# Patient Record
Sex: Female | Born: 1940 | Race: Black or African American | Hispanic: No | Marital: Single | Smoking: Never smoker
Health system: Southern US, Community
[De-identification: ages and names within clinical notes are randomized; demographics above are authoritative.]

## PROBLEM LIST (undated history)

## (undated) DIAGNOSIS — I10 Essential (primary) hypertension: Secondary | ICD-10-CM

## (undated) HISTORY — PX: CHOLECYSTECTOMY: SHX55

## (undated) HISTORY — PX: TOE SURGERY: SHX1073

## (undated) HISTORY — PX: KNEE ARTHROSCOPY: SUR90

## (undated) HISTORY — PX: APPENDECTOMY: SHX54

---

## 1998-01-28 ENCOUNTER — Emergency Department (HOSPITAL_COMMUNITY): Admission: EM | Admit: 1998-01-28 | Discharge: 1998-01-28 | Payer: Self-pay | Admitting: Emergency Medicine

## 1998-10-11 ENCOUNTER — Ambulatory Visit (HOSPITAL_COMMUNITY): Admission: RE | Admit: 1998-10-11 | Discharge: 1998-10-11 | Payer: Self-pay | Admitting: Cardiology

## 1998-10-11 ENCOUNTER — Encounter: Payer: Self-pay | Admitting: Cardiology

## 1998-12-07 ENCOUNTER — Encounter: Payer: Self-pay | Admitting: Gastroenterology

## 1998-12-07 ENCOUNTER — Ambulatory Visit (HOSPITAL_COMMUNITY): Admission: RE | Admit: 1998-12-07 | Discharge: 1998-12-07 | Payer: Self-pay | Admitting: Gastroenterology

## 1998-12-07 ENCOUNTER — Encounter: Payer: Self-pay | Admitting: General Surgery

## 1998-12-07 ENCOUNTER — Inpatient Hospital Stay (HOSPITAL_COMMUNITY): Admission: AD | Admit: 1998-12-07 | Discharge: 1998-12-10 | Payer: Self-pay | Admitting: Psychology

## 1999-02-27 ENCOUNTER — Ambulatory Visit (HOSPITAL_COMMUNITY): Admission: RE | Admit: 1999-02-27 | Discharge: 1999-02-27 | Payer: Self-pay | Admitting: Cardiology

## 1999-10-13 ENCOUNTER — Encounter: Payer: Self-pay | Admitting: Cardiology

## 1999-10-13 ENCOUNTER — Ambulatory Visit (HOSPITAL_COMMUNITY): Admission: RE | Admit: 1999-10-13 | Discharge: 1999-10-13 | Payer: Self-pay | Admitting: Cardiology

## 2001-01-07 ENCOUNTER — Ambulatory Visit (HOSPITAL_COMMUNITY): Admission: RE | Admit: 2001-01-07 | Discharge: 2001-01-07 | Payer: Self-pay | Admitting: Cardiology

## 2001-01-07 ENCOUNTER — Encounter: Payer: Self-pay | Admitting: Cardiology

## 2001-03-05 ENCOUNTER — Emergency Department (HOSPITAL_COMMUNITY): Admission: EM | Admit: 2001-03-05 | Discharge: 2001-03-05 | Payer: Self-pay

## 2001-05-15 ENCOUNTER — Ambulatory Visit (HOSPITAL_COMMUNITY): Admission: RE | Admit: 2001-05-15 | Discharge: 2001-05-15 | Payer: Self-pay | Admitting: Gastroenterology

## 2002-01-12 ENCOUNTER — Encounter: Payer: Self-pay | Admitting: Cardiology

## 2002-01-12 ENCOUNTER — Ambulatory Visit (HOSPITAL_COMMUNITY): Admission: RE | Admit: 2002-01-12 | Discharge: 2002-01-12 | Payer: Self-pay | Admitting: Cardiology

## 2003-01-14 ENCOUNTER — Ambulatory Visit (HOSPITAL_COMMUNITY): Admission: RE | Admit: 2003-01-14 | Discharge: 2003-01-14 | Payer: Self-pay | Admitting: Cardiology

## 2003-01-14 ENCOUNTER — Encounter: Payer: Self-pay | Admitting: Cardiology

## 2003-10-25 ENCOUNTER — Ambulatory Visit (HOSPITAL_COMMUNITY): Admission: RE | Admit: 2003-10-25 | Discharge: 2003-10-25 | Payer: Self-pay | Admitting: Cardiology

## 2004-01-17 ENCOUNTER — Ambulatory Visit (HOSPITAL_COMMUNITY): Admission: RE | Admit: 2004-01-17 | Discharge: 2004-01-17 | Payer: Self-pay | Admitting: Cardiology

## 2004-10-03 ENCOUNTER — Ambulatory Visit (HOSPITAL_COMMUNITY): Admission: RE | Admit: 2004-10-03 | Discharge: 2004-10-03 | Payer: Self-pay | Admitting: Cardiology

## 2005-01-17 ENCOUNTER — Ambulatory Visit (HOSPITAL_COMMUNITY): Admission: RE | Admit: 2005-01-17 | Discharge: 2005-01-17 | Payer: Self-pay | Admitting: Cardiology

## 2005-03-22 ENCOUNTER — Emergency Department (HOSPITAL_COMMUNITY): Admission: EM | Admit: 2005-03-22 | Discharge: 2005-03-22 | Payer: Self-pay | Admitting: Emergency Medicine

## 2005-04-02 ENCOUNTER — Ambulatory Visit (HOSPITAL_COMMUNITY): Admission: RE | Admit: 2005-04-02 | Discharge: 2005-04-02 | Payer: Self-pay | Admitting: Cardiology

## 2006-01-21 ENCOUNTER — Emergency Department (HOSPITAL_COMMUNITY): Admission: EM | Admit: 2006-01-21 | Discharge: 2006-01-21 | Payer: Self-pay | Admitting: Emergency Medicine

## 2006-02-28 ENCOUNTER — Ambulatory Visit (HOSPITAL_COMMUNITY): Admission: RE | Admit: 2006-02-28 | Discharge: 2006-02-28 | Payer: Self-pay | Admitting: Cardiology

## 2006-03-08 ENCOUNTER — Encounter: Admission: RE | Admit: 2006-03-08 | Discharge: 2006-03-08 | Payer: Self-pay | Admitting: Cardiology

## 2006-03-12 ENCOUNTER — Ambulatory Visit (HOSPITAL_COMMUNITY): Admission: RE | Admit: 2006-03-12 | Discharge: 2006-03-13 | Payer: Self-pay | Admitting: General Surgery

## 2006-03-12 ENCOUNTER — Encounter (INDEPENDENT_AMBULATORY_CARE_PROVIDER_SITE_OTHER): Payer: Self-pay | Admitting: *Deleted

## 2007-03-03 ENCOUNTER — Encounter: Admission: RE | Admit: 2007-03-03 | Discharge: 2007-03-03 | Payer: Self-pay | Admitting: Cardiology

## 2007-12-22 ENCOUNTER — Emergency Department (HOSPITAL_BASED_OUTPATIENT_CLINIC_OR_DEPARTMENT_OTHER): Admission: EM | Admit: 2007-12-22 | Discharge: 2007-12-22 | Payer: Self-pay | Admitting: Emergency Medicine

## 2007-12-29 ENCOUNTER — Emergency Department (HOSPITAL_BASED_OUTPATIENT_CLINIC_OR_DEPARTMENT_OTHER): Admission: EM | Admit: 2007-12-29 | Discharge: 2007-12-29 | Payer: Self-pay | Admitting: Emergency Medicine

## 2008-03-04 ENCOUNTER — Encounter: Admission: RE | Admit: 2008-03-04 | Discharge: 2008-03-04 | Payer: Self-pay | Admitting: Cardiology

## 2008-08-28 ENCOUNTER — Emergency Department (HOSPITAL_BASED_OUTPATIENT_CLINIC_OR_DEPARTMENT_OTHER): Admission: EM | Admit: 2008-08-28 | Discharge: 2008-08-28 | Payer: Self-pay | Admitting: Emergency Medicine

## 2008-12-04 ENCOUNTER — Emergency Department (HOSPITAL_BASED_OUTPATIENT_CLINIC_OR_DEPARTMENT_OTHER): Admission: EM | Admit: 2008-12-04 | Discharge: 2008-12-04 | Payer: Self-pay | Admitting: Emergency Medicine

## 2009-03-07 ENCOUNTER — Encounter: Admission: RE | Admit: 2009-03-07 | Discharge: 2009-03-07 | Payer: Self-pay | Admitting: Cardiology

## 2010-03-08 ENCOUNTER — Encounter: Admission: RE | Admit: 2010-03-08 | Discharge: 2010-03-08 | Payer: Self-pay | Admitting: Cardiology

## 2010-08-13 ENCOUNTER — Encounter: Payer: Self-pay | Admitting: Cardiology

## 2010-10-31 LAB — RAPID STREP SCREEN (MED CTR MEBANE ONLY): Streptococcus, Group A Screen (Direct): NEGATIVE

## 2010-12-08 NOTE — Op Note (Signed)
Theresa Moon, Theresa Moon            ACCOUNT NO.:  [id number]   MEDICAL RECORD NO.:  [id number]          PATIENT TYPE:  OIB   LOCATION:  1413                         FACILITY:  [facility]   PHYSICIAN:  Adolph Pollack, M.D.DATE OF BIRTH:  [birth year]   DATE OF PROCEDURE:  03/12/2006  DATE OF DISCHARGE:                                 OPERATIVE REPORT   PREOPERATIVE DIAGNOSIS:  Symptomatic cholelithiasis.   POSTOPERATIVE DIAGNOSIS:  Cholelithiasis with chronic cholecystitis.   PROCEDURE:  Laparoscopic cholecystectomy with intraoperative cholangiogram.   SURGEON:  Dr. Abbey Chatters   ASSISTANT:  Baruch Merl, MD   ANESTHESIA:  General.   INDICATIONS:  This is a 70 year old female who had a case of biliary colic  and found to have gallstones.  I saw her in the office and we talked about  elective cholecystectomy.  She was not sure whether she wanted to proceed at  that time but called back shortly thereafter and decided she wanted to  schedule elective surgery and now presents for that.   TECHNIQUE:  She is seen in the holding area then brought to the operating  room, placed supine on the operating table, and a general anesthetic was  administered.  Her abdominal wall was sterilely prepped and draped.  Then  0.25% plain Marcaine solution was infiltrated in the subumbilical region and  a subumbilical incision made through the skin, subcutaneous tissue, fascia,  and peritoneum entering the peritoneal cavity.  Pursestring suture of 0  Vicryl was placed around the fascial edges.  A Hassan trocar was introduced  to the peritoneal cavity and pneumoperitoneum was created by insufflation of  CO2 gas.   She was placed in reverse Trendelenburg position, and an 11 mm trocar was  placed in the epigastrium and two 5 mm trocars placed in the right mid  lateral abdomen.  She was then rotated to the left.  The fundus of the  gallbladder was grasped.  The gallbladder was somewhat pale in color  consistent with chronic inflammatory changes.  The fundus was retracted  toward the right shoulder.  The infundibulum was grasped using careful blunt  dissection.  Electrocautery was mobilized.  I noted a posterior branch of  the cystic artery which is clipped and divided.  I then isolated the cystic  duct and cystic artery and created windows around them.  I clipped the  cystic artery proximally and distally and divided it.  I then placed a clip  at the cystic duct gallbladder junction and made a small incision in the  cystic duct.  Bile was milked back, but no stones were milked back.  A  cholangiocatheter was passed through the anterior abdominal wall, placed  into the cystic duct, and a cholangiogram was performed.   Under real time fluoroscopy, dilute contrast material was injected into the  cystic duct which was of moderate to long length.  The common hepatic, right  and left hepatic, and common bile ducts all were visualized, and the  contrast spilled into the duodenum promptly without obvious evidence of  obstruction.  Final reports pending the radiologist's interpretation.   The cholangiocatheter  was removed.  The cystic duct was clipped 3 times  proximally and divided.  The gallbladder was dissected free from the liver  bed intact with electrocautery and placed in an Endopouch bag.  The  gallbladder fossa was irrigated.  Hemostasis was adequate, and no bile leak  was noted.  The perihepatic area was irrigated and fluid evacuated by way of  suction.   The gallbladder was removed the subumbilical incision, and then the  subumbilical fascial defect was closed under laparoscopic vision by  tightening up and tying down the pursestring suture.  The remaining trocars  were removed, and pneumoperitoneum was released.  The skin incisions were  closed with 4-0 Monocryl subcuticular stitches followed by Steri-Strips and  sterile dressings.   She tolerated the procedure well without  apparent complications and was  taken to the recovery room in satisfactory condition.      Adolph Pollack, M.D.  Electronically Signed     TJR/MEDQ  D:  03/12/2006  T:  03/12/2006  Job:  [id number]   cc:   Eduardo Osier. Sharyn Lull, M.D.  Fax: [phone]

## 2010-12-08 NOTE — Op Note (Signed)
[place]. [facility]  Patient:    Theresa Moon, Theresa Moon Visit Number: [id number] MRN: [id number]          Service Type: END Location: ENDO Attending Physician:  Charna Elizabeth Dictated by:   Anselmo Rod, M.D. Proc. Date: 05/15/01 Admit Date:  05/15/2001 Discharge Date: 05/15/2001   CC:         Eduardo Osier. Sharyn Lull, M.D.   Operative Report  DATE OF BIRTH:  01/28/1941.  PROCEDURE PERFORMED:  Esophagogastroduodenoscopy.  ENDOSCOPIST:  Anselmo Rod, M.D.  INSTRUMENT USED:  Olympus video pan endoscope.  INDICATION FOR PROCEDURE:  A 70 year old African-American female with epigastric pain.  Rule out peptic ulcer disease, esophagitis, gastritis, etc.  PREPROCEDURE PREPARATION:  Informed consent was procured from the patient. The patient was fasted for eight hours prior to procedure.  PREPROCEDURE PHYSICAL:  VITAL SIGNS:  The patient has stable vital signs.  NECK:  Supple.  CHEST:  Clear to auscultation.  S1, S2 regular.  ABDOMEN:  Soft with normal bowel sounds.  DESCRIPTION OF PROCEDURE:  The patient was placed in the left lateral decubitus position and sedated with 55 mg of Demerol and 5 mg of Versed intravenously.  Once the patient was adequately sedate and maintained on low-flow oxygen and continuous cardiac monitoring, the Olympus video pan endoscope was advanced through a mouthpiece over the tongue into the esophagus under direct vision.  The entire esophagus appeared normal without evidence of ring stricture, masses, lesions, esophagitis, or Barretts mucosa.  The scope was then advanced into the stomach.  Except for a small hiatal hernia, no other abnormalities were seen.  The entire gastric mucosa and ______ small bowel appeared normal.  IMPRESSION:  Normal esophagogastroduodenoscopy except for small hiatal hernia.  RECOMMENDATIONS:  Proceed with colonoscopy at this time. Dictated by:   Anselmo Rod, M.D. Attending Physician:   Charna Elizabeth DD:  05/15/01 TD:  05/18/01 Job: [id number]

## 2010-12-08 NOTE — Procedures (Signed)
[place]. [facility]  Patient:    Theresa Moon, Theresa Moon Visit Number: [id number] MRN: [id number]          Service Type: END Location: ENDO Attending Physician:  Charna Elizabeth Dictated by:   Anselmo Rod, M.D. Proc. Date: 05/15/01 Admit Date:  05/15/2001 Discharge Date: 05/15/2001   CC:         Eduardo Osier. Sharyn Lull, M.D.   Procedure Report  DATE OF BIRTH:  12-30-1940.  PROCEDURE:  Colonoscopy.  ENDOSCOPIST:  Anselmo Rod, M.D.  INSTRUMENT USED:  Olympus video colonoscope.  INDICATION FOR PROCEDURE:  Guaiac-positive stools in a 70 year old African-American female.  Rule out colonic polyps, masses, hemorrhoids, etc.  PREPROCEDURE PREPARATION:  Informed consent was procured from the patient. The patient was fasted for eight hours prior to the procedure.  PREPROCEDURE PHYSICAL:  VITAL SIGNS:  The patient had stable vital signs.  NECK:  Supple.  CHEST:  Clear to auscultation.  S1, S2 regular.  ABDOMEN:  Soft with normal bowel sounds.  DESCRIPTION OF PROCEDURE:  The patient was placed in the left lateral decubitus position and sedated with 40 mg of Demerol and 4 mg of Versed intravenously.  Once the patient was adequately sedate and maintained on low-flow oxygen and continuous cardiac monitoring, the Olympus video colonoscope was advanced from the rectum to the cecum without difficulty. Except for moderate-sized internal hemorrhoids, no other abnormalities were noted.  The patient tolerated the procedure well without complication.  IMPRESSION:  A healthy-appearing colon except for moderate-sized internal hemorrhoids.  RECOMMENDATIONS: 1. Repeat guaiac testing will be done on an outpatient basis and further    recommendations will be made as needed. 2. A high-fiber diet has been suggested for now. 3. Outpatient follow-up has been advised within the next four weeks. Dictated by:   Anselmo Rod, M.D. Attending Physician:  Charna Elizabeth DD:  05/15/01 TD:  05/18/01 Job: [id number]

## 2011-02-06 ENCOUNTER — Other Ambulatory Visit: Payer: Self-pay | Admitting: Cardiology

## 2011-02-06 ENCOUNTER — Other Ambulatory Visit (HOSPITAL_COMMUNITY): Payer: Self-pay | Admitting: Cardiology

## 2011-02-06 DIAGNOSIS — Z1231 Encounter for screening mammogram for malignant neoplasm of breast: Secondary | ICD-10-CM

## 2011-02-15 ENCOUNTER — Ambulatory Visit
Admission: RE | Admit: 2011-02-15 | Discharge: 2011-02-15 | Disposition: A | Discharge status: Home / Self Care | Payer: Medicare Other | Source: Ambulatory Visit | Attending: Cardiology | Admitting: Cardiology

## 2011-02-15 DIAGNOSIS — Z1231 Encounter for screening mammogram for malignant neoplasm of breast: Secondary | ICD-10-CM

## 2011-04-19 LAB — BASIC METABOLIC PANEL
GFR calc non Af Amer: >60
Glucose, Bld: 93
Potassium: 4.4

## 2011-04-19 LAB — DIFFERENTIAL
Basophils Absolute: 0
Eosinophils Absolute: 0.1
Eosinophils Relative: 3
Lymphocytes Relative: 40
Lymphs Abs: 1.9
Monocytes Absolute: 0.5

## 2011-04-19 LAB — CBC
MCHC: 34.3
MCV: 86.8
Platelets: 306
RBC: 4.62

## 2012-02-20 ENCOUNTER — Other Ambulatory Visit: Payer: Self-pay | Admitting: Cardiology

## 2012-02-20 DIAGNOSIS — Z1231 Encounter for screening mammogram for malignant neoplasm of breast: Secondary | ICD-10-CM

## 2012-03-04 ENCOUNTER — Ambulatory Visit: Payer: Medicare Other

## 2012-03-05 ENCOUNTER — Ambulatory Visit
Admission: RE | Admit: 2012-03-05 | Discharge: 2012-03-05 | Disposition: A | Discharge status: Home / Self Care | Payer: Medicare Other | Source: Ambulatory Visit | Attending: Cardiology | Admitting: Cardiology

## 2012-03-05 DIAGNOSIS — Z1231 Encounter for screening mammogram for malignant neoplasm of breast: Secondary | ICD-10-CM

## 2012-05-07 ENCOUNTER — Emergency Department (HOSPITAL_BASED_OUTPATIENT_CLINIC_OR_DEPARTMENT_OTHER): Payer: Medicare Other

## 2012-05-07 ENCOUNTER — Encounter (HOSPITAL_BASED_OUTPATIENT_CLINIC_OR_DEPARTMENT_OTHER): Payer: Self-pay | Admitting: Family Medicine

## 2012-05-07 ENCOUNTER — Emergency Department (HOSPITAL_BASED_OUTPATIENT_CLINIC_OR_DEPARTMENT_OTHER)
Admission: EM | Admit: 2012-05-07 | Discharge: 2012-05-07 | Disposition: A | Discharge status: Home / Self Care | Payer: Medicare Other | Attending: Emergency Medicine | Admitting: Emergency Medicine

## 2012-05-07 DIAGNOSIS — J40 Bronchitis, not specified as acute or chronic: Secondary | ICD-10-CM | POA: Insufficient documentation

## 2012-05-07 DIAGNOSIS — J069 Acute upper respiratory infection, unspecified: Secondary | ICD-10-CM

## 2012-05-07 DIAGNOSIS — I10 Essential (primary) hypertension: Secondary | ICD-10-CM | POA: Insufficient documentation

## 2012-05-07 HISTORY — DX: Essential (primary) hypertension: I10

## 2012-05-07 MED ORDER — IPRATROPIUM BROMIDE 0.02 % IN SOLN
0.5000 mg | Freq: Once | RESPIRATORY_TRACT | Status: AC
  Administered 2012-05-07: 0.5 mg via RESPIRATORY_TRACT
  Filled 2012-05-07: qty 2.5

## 2012-05-07 MED ORDER — ALBUTEROL SULFATE HFA 108 (90 BASE) MCG/ACT IN AERS: 2.0000 | INHALATION_SPRAY | RESPIRATORY_TRACT | Status: DC | PRN

## 2012-05-07 MED ORDER — PREDNISONE 50 MG PO TABS: 50.0000 mg | ORAL_TABLET | Freq: Every day | ORAL | Status: DC

## 2012-05-07 MED ORDER — ALBUTEROL SULFATE (5 MG/ML) 0.5% IN NEBU
2.5000 mg | INHALATION_SOLUTION | RESPIRATORY_TRACT | Status: AC
  Administered 2012-05-07: 2.5 mg via RESPIRATORY_TRACT
  Filled 2012-05-07: qty 0.5

## 2012-05-07 MED ORDER — PREDNISONE 50 MG PO TABS
60.0000 mg | ORAL_TABLET | Freq: Once | ORAL | Status: AC
  Administered 2012-05-07: 60 mg via ORAL
  Filled 2012-05-07: qty 1

## 2012-05-07 NOTE — ED Notes (Signed)
Pt c/o throat feeling sore, pt sts "I'm afraid I am getting bronchitis". Pt c/o mild cough with clear phlegm and dull headache x 2 days. Pt denies n/v/d.

## 2012-05-07 NOTE — ED Notes (Signed)
Patient transported to X-ray 

## 2012-05-07 NOTE — ED Provider Notes (Signed)
History     CSN: [id number]  Arrival date & time 05/07/12  1104   First MD Initiated Contact with Patient 05/07/12 1253      Chief Complaint  Patient presents with  . Sore Throat    (Consider location/radiation/quality/duration/timing/severity/associated sxs/prior treatment) Patient is a 71 y.o. female presenting with pharyngitis. The history is provided by the patient.  Sore Throat  She has had a respiratory infection for the last 5 days. She has had some rhinorrhea and postnasal drainage, sore throat, ringing in her ears, and a cough which is productive of a small amount of clear to slightly yellow sputum. She has had subjective fever as well as chills and sweats. She denies dyspnea, nausea, vomiting. She denies arthralgias and myalgias. She has not taken any medication. Cough is worse at night, nothing else seems to affect her symptoms.  Past Medical History  Diagnosis Date  . Hypertension     Past Surgical History  Procedure Date  . Toe surgery   . Appendectomy   . Cholecystectomy   . Knee arthroscopy     No family history on file.  History  Substance Use Topics  . Smoking status: Never Smoker   . Smokeless tobacco: Not on file  . Alcohol Use: No    OB History    Grav Para Term Preterm Abortions TAB SAB Ect Mult Living                  Review of Systems  All other systems reviewed and are negative.    Allergies  Motrin  Home Medications   Current Outpatient Rx  Name Route Sig Dispense Refill  . ASPIRIN 81 MG PO TABS Oral Take 81 mg by mouth daily.    Marland Kitchen TIAZAC PO Oral Take by mouth.    . RABEPRAZOLE SODIUM 20 MG PO TBEC Oral Take 20 mg by mouth daily.    Marland Kitchen RAMIPRIL PO Oral Take by mouth.    . ROSUVASTATIN CALCIUM 10 MG PO TABS Oral Take 10 mg by mouth daily.      BP 176/80  Pulse 61  Temp 97.5 F (36.4 C) (Oral)  Resp 16  Ht 5\' 3"  (1.6 m)  Wt 150 lb (68.04 kg)  BMI 26.57 kg/m2  SpO2 99%  Physical Exam  Nursing note and vitals  reviewed.  71 year old female, resting comfortably and in no acute distress. Vital signs are significant for hypertension with blood pressure 176/80. Oxygen saturation is 99%, which is normal. Head is normocephalic and atraumatic. PERRLA, EOMI. Pharynx is mildly to moderately erythematous without exudate. Neck is nontender and supple without adenopathy or JVD. Back is nontender and there is no CVA tenderness. Lungs are clear without rales, wheezes, or rhonchi. Chest is nontender. Heart has regular rate and rhythm without murmur. Abdomen is soft, flat, nontender without masses or hepatosplenomegaly and peristalsis is normoactive. Extremities have no cyanosis or edema, full range of motion is present. Skin is warm and dry without rash. Neurologic: Mental status is normal, cranial nerves are intact, there are no motor or sensory deficits.  ED Course  Procedures (including critical care time)  Results for orders placed during the hospital encounter of 05/07/12  RAPID STREP SCREEN      Component Value Range   Streptococcus, Group A Screen (Direct) NEGATIVE  NEGATIVE   Dg Chest 2 View  05/07/2012  *RADIOLOGY REPORT*  Clinical Data: Cough  CHEST - 2 VIEW  Comparison: 01/21/2006  Findings: Normal heart size  and normal pulmonary vascularity. Negative for pneumonia or effusion.  Lungs are clear.  IMPRESSION: Negative   Original Report Authenticated By: Camelia Phenes, M.D.    Images viewed by me.   1. Upper respiratory infection   2. Bronchitis       MDM  Respiratory tract infection. Strep screen will be obtained to rule out streptococcal infection, chest x-ray will be obtained to rule out pneumonia, and she'll be given a therapeutic trial of albuterol with Atrovent as if it helps her cough.  She does significant subjective relief from albuterol with Atrovent. Chest x-ray and strep screens are negative. She will be sent home with prescriptions for prednisone and albuterol inhaler. I do  not see any indication for antibiotic use at this point. She's to followup with her PCP if not improving     Dione Booze, MD 05/07/12 [phone]

## 2013-05-12 ENCOUNTER — Other Ambulatory Visit: Payer: Self-pay

## 2013-05-12 DIAGNOSIS — Z1231 Encounter for screening mammogram for malignant neoplasm of breast: Secondary | ICD-10-CM

## 2013-06-14 ENCOUNTER — Emergency Department (HOSPITAL_BASED_OUTPATIENT_CLINIC_OR_DEPARTMENT_OTHER)
Admission: EM | Admit: 2013-06-14 | Discharge: 2013-06-14 | Disposition: A | Discharge status: Home / Self Care | Payer: Medicare Other | Attending: Emergency Medicine | Admitting: Emergency Medicine

## 2013-06-14 ENCOUNTER — Emergency Department (HOSPITAL_BASED_OUTPATIENT_CLINIC_OR_DEPARTMENT_OTHER): Payer: Medicare Other

## 2013-06-14 ENCOUNTER — Encounter (HOSPITAL_BASED_OUTPATIENT_CLINIC_OR_DEPARTMENT_OTHER): Payer: Self-pay | Admitting: Emergency Medicine

## 2013-06-14 DIAGNOSIS — I10 Essential (primary) hypertension: Secondary | ICD-10-CM | POA: Insufficient documentation

## 2013-06-14 DIAGNOSIS — J40 Bronchitis, not specified as acute or chronic: Secondary | ICD-10-CM

## 2013-06-14 DIAGNOSIS — J029 Acute pharyngitis, unspecified: Secondary | ICD-10-CM | POA: Insufficient documentation

## 2013-06-14 DIAGNOSIS — Z79899 Other long term (current) drug therapy: Secondary | ICD-10-CM | POA: Insufficient documentation

## 2013-06-14 DIAGNOSIS — Z7982 Long term (current) use of aspirin: Secondary | ICD-10-CM | POA: Insufficient documentation

## 2013-06-14 DIAGNOSIS — J209 Acute bronchitis, unspecified: Secondary | ICD-10-CM | POA: Insufficient documentation

## 2013-06-14 MED ORDER — DOXYCYCLINE HYCLATE 100 MG PO CAPS: 100.0000 mg | ORAL_CAPSULE | Freq: Two times a day (BID) | ORAL | Status: DC

## 2013-06-14 MED ORDER — HYDROCOD POLST-CHLORPHEN POLST 10-8 MG/5ML PO LQCR: 5.0000 mL | Freq: Every evening | ORAL | Status: DC | PRN

## 2013-06-14 NOTE — ED Notes (Signed)
Patient transported to X-ray 

## 2013-06-14 NOTE — ED Provider Notes (Signed)
CSN: [id number]     Arrival date & time 06/14/13  1010 History   First MD Initiated Contact with Patient 06/14/13 1039     Chief Complaint  Patient presents with  . Nasal Congestion   (Consider location/radiation/quality/duration/timing/severity/associated sxs/prior Treatment) HPI Comments: Patient presents with nasal congestion. She states 4 days ago she started having some nasal congestion that seems to be worsening and she's had a little bit of blood in her sputum now. She does have a cough that seems to be worsening over last few days. She denies any chest pain or shortness of breath. She denies any fevers or chills. She had a little bit of bleeding from her nose as well. She denies any leg swelling. She denies any nausea vomiting or diarrhea. She's been taking over-the-counter medicines with some improvement of symptoms.   Past Medical History  Diagnosis Date  . Hypertension    Past Surgical History  Procedure Laterality Date  . Toe surgery    . Appendectomy    . Cholecystectomy    . Knee arthroscopy     No family history on file. History  Substance Use Topics  . Smoking status: Never Smoker   . Smokeless tobacco: Not on file  . Alcohol Use: No   OB History   Grav Para Term Preterm Abortions TAB SAB Ect Mult Living                 Review of Systems  Constitutional: Positive for fatigue. Negative for fever, chills and diaphoresis.  HENT: Positive for congestion, postnasal drip, rhinorrhea, sneezing and sore throat. Negative for facial swelling.   Eyes: Negative.   Respiratory: Positive for cough. Negative for chest tightness and shortness of breath.   Cardiovascular: Negative for chest pain and leg swelling.  Gastrointestinal: Negative for nausea, vomiting, abdominal pain, diarrhea and blood in stool.  Genitourinary: Negative for frequency, hematuria, flank pain and difficulty urinating.  Musculoskeletal: Negative for arthralgias and back pain.  Skin: Negative for  rash.  Neurological: Negative for dizziness, speech difficulty, weakness, numbness and headaches.    Allergies  Motrin  Home Medications   Current Outpatient Rx  Name  Route  Sig  Dispense  Refill  . aspirin 81 MG tablet   Oral   Take 81 mg by mouth daily.         . chlorpheniramine-HYDROcodone (TUSSIONEX PENNKINETIC ER) 10-8 MG/5ML LQCR   Oral   Take 5 mLs by mouth at bedtime as needed for cough.   115 mL   0   . Diltiazem HCl ER Beads (TIAZAC PO)   Oral   Take by mouth.         . doxycycline (VIBRAMYCIN) 100 MG capsule   Oral   Take 1 capsule (100 mg total) by mouth 2 (two) times daily. One po bid x 7 days   14 capsule   0   . RABEprazole (ACIPHEX) 20 MG tablet   Oral   Take 20 mg by mouth daily.         Marland Kitchen RAMIPRIL PO   Oral   Take by mouth.         . rosuvastatin (CRESTOR) 10 MG tablet   Oral   Take 10 mg by mouth daily.          BP 141/76  Pulse 71  Temp(Src) 98.3 F (36.8 C) (Oral)  Resp 16  Wt 159 lb (72.122 kg)  SpO2 99% Physical Exam  Constitutional: She is oriented to  person, place, and time. She appears well-developed and well-nourished.  HENT:  Head: Normocephalic and atraumatic.  Right Ear: External ear normal.  Left Ear: External ear normal.  Mouth/Throat: Oropharynx is clear and moist.  No erythema or exudates of the posterior pharynx  Eyes: Pupils are equal, round, and reactive to light.  Neck: Normal range of motion. Neck supple.  Cardiovascular: Normal rate, regular rhythm and normal heart sounds.   Pulmonary/Chest: Effort normal and breath sounds normal. No respiratory distress. She has no wheezes. She has no rales. She exhibits no tenderness.  Abdominal: Soft. Bowel sounds are normal. There is no tenderness. There is no rebound and no guarding.  Musculoskeletal: Normal range of motion. She exhibits no edema.  Lymphadenopathy:    She has no cervical adenopathy.  Neurological: She is alert and oriented to person, place, and  time.  Skin: Skin is warm and dry. No rash noted.  Psychiatric: She has a normal mood and affect.    ED Course  Procedures (including critical care time) Labs Review Labs Reviewed - No data to display Imaging Review Dg Chest 2 View  06/14/2013   CLINICAL DATA:  Cough, congestion.  EXAM: CHEST  2 VIEW  COMPARISON:  05/07/2012  FINDINGS: Heart is upper limits normal in size. Mediastinal contours are within normal limits. Lungs are clear. No effusions. No acute bony abnormality. Degenerative changes in the lumbar spine and shoulders.  IMPRESSION: No active cardiopulmonary disease.   Electronically Signed   By: Charlett Nose M.D.   On: 06/14/2013 11:28    EKG Interpretation   None       MDM   1. Bronchitis    Patient no evidence of pneumonia. She's otherwise well-appearing. She was given prescription for doxycycline and some cough syrup to use at home. She is encouraged to followup with her primary care physician if her symptoms are not improving within the next couple days.    Rolan Bucco, MD 06/14/13 1158

## 2013-06-17 ENCOUNTER — Ambulatory Visit: Payer: Medicare Other

## 2014-02-03 ENCOUNTER — Ambulatory Visit
Admission: RE | Admit: 2014-02-03 | Discharge: 2014-02-03 | Disposition: A | Discharge status: Home / Self Care | Payer: Medicare Other | Source: Ambulatory Visit

## 2014-02-03 DIAGNOSIS — Z1231 Encounter for screening mammogram for malignant neoplasm of breast: Secondary | ICD-10-CM

## 2014-11-19 IMAGING — MG MM SCREEN MAMMOGRAM BILATERAL
4 series · 4 of 4 positions shown · non-contrast
Comparison: Previous exam(s).

CLINICAL DATA: Screening.

EXAM:
DIGITAL SCREENING BILATERAL MAMMOGRAM WITH CAD

[R CC]
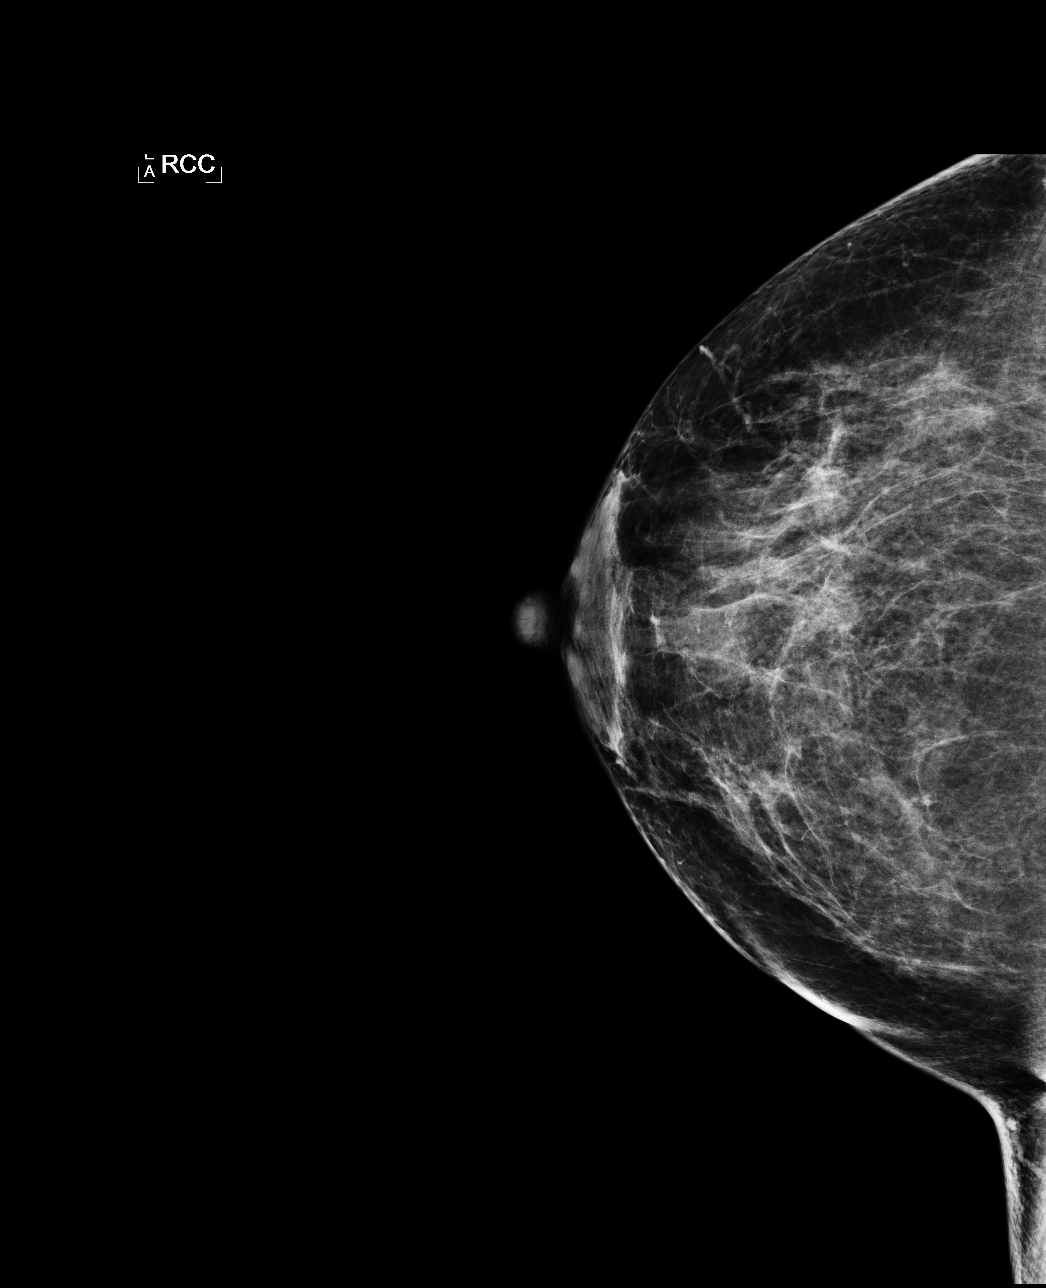

[L CC]
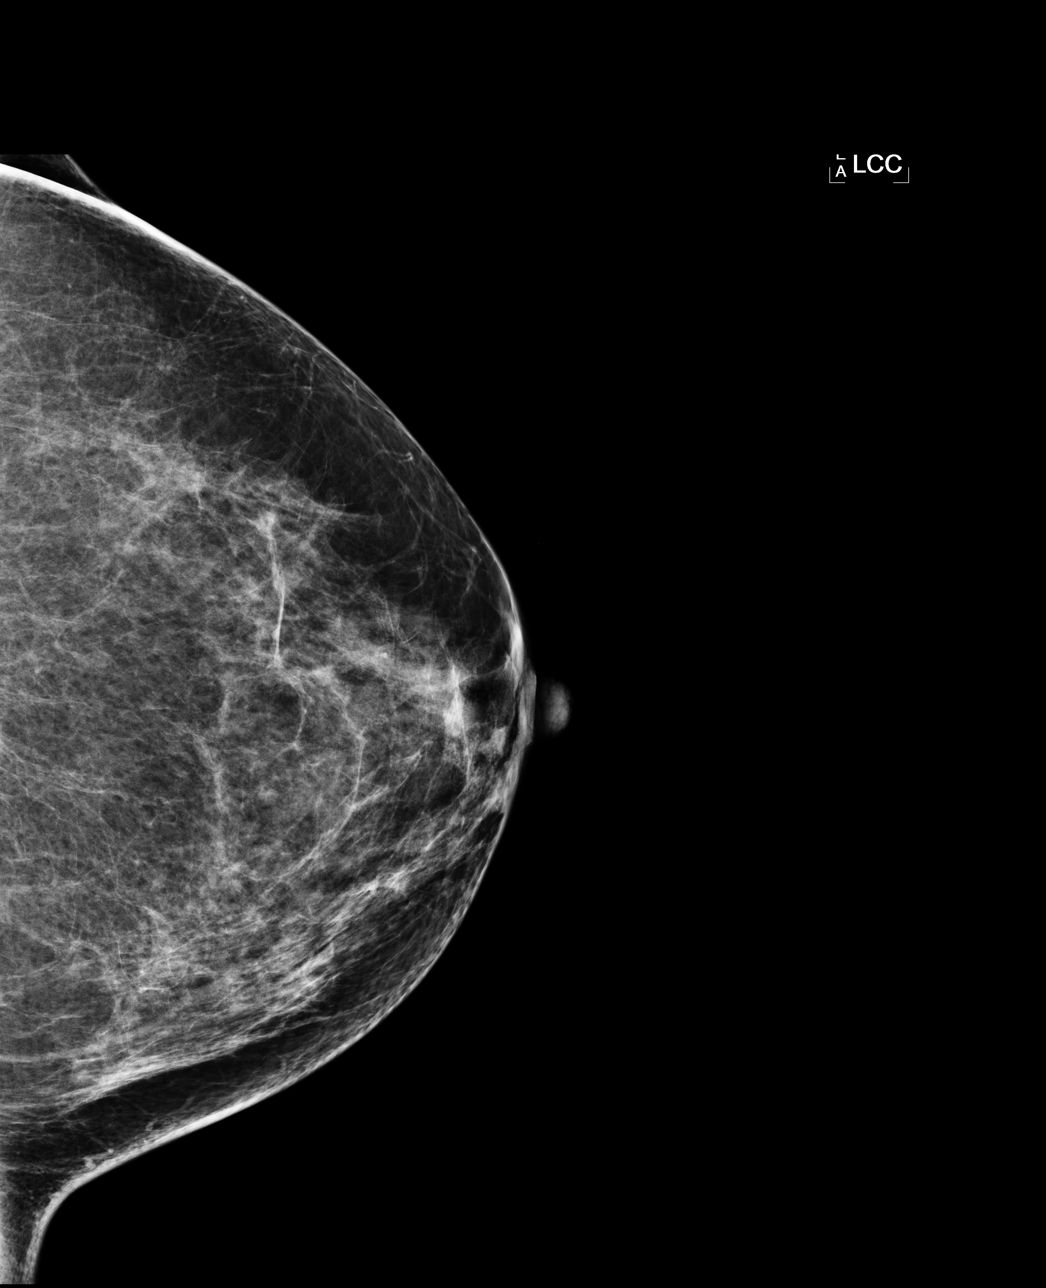

[L MLO]
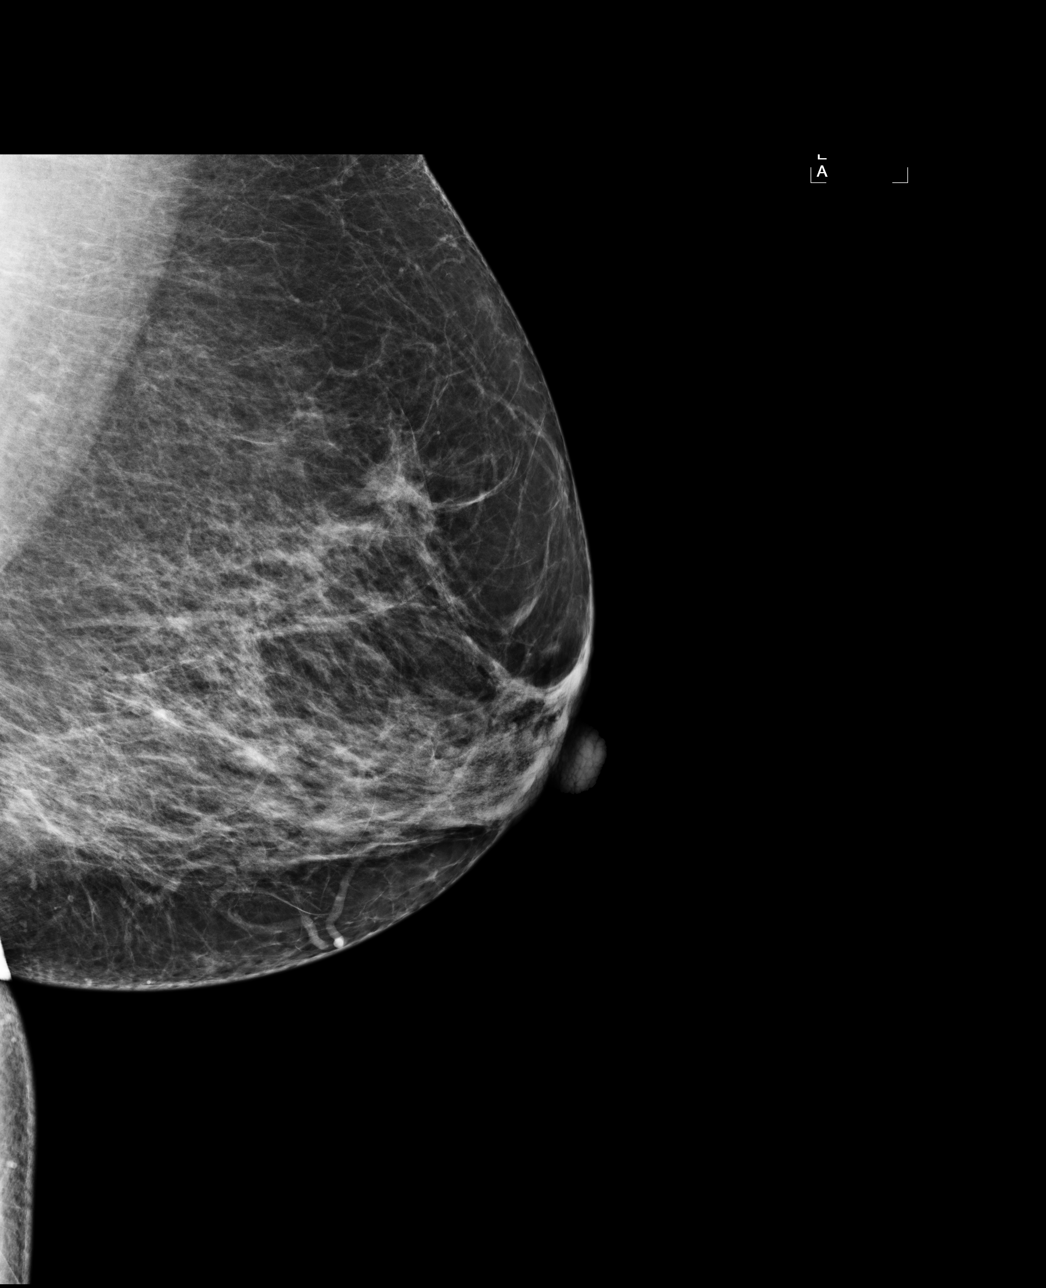

[R MLO]
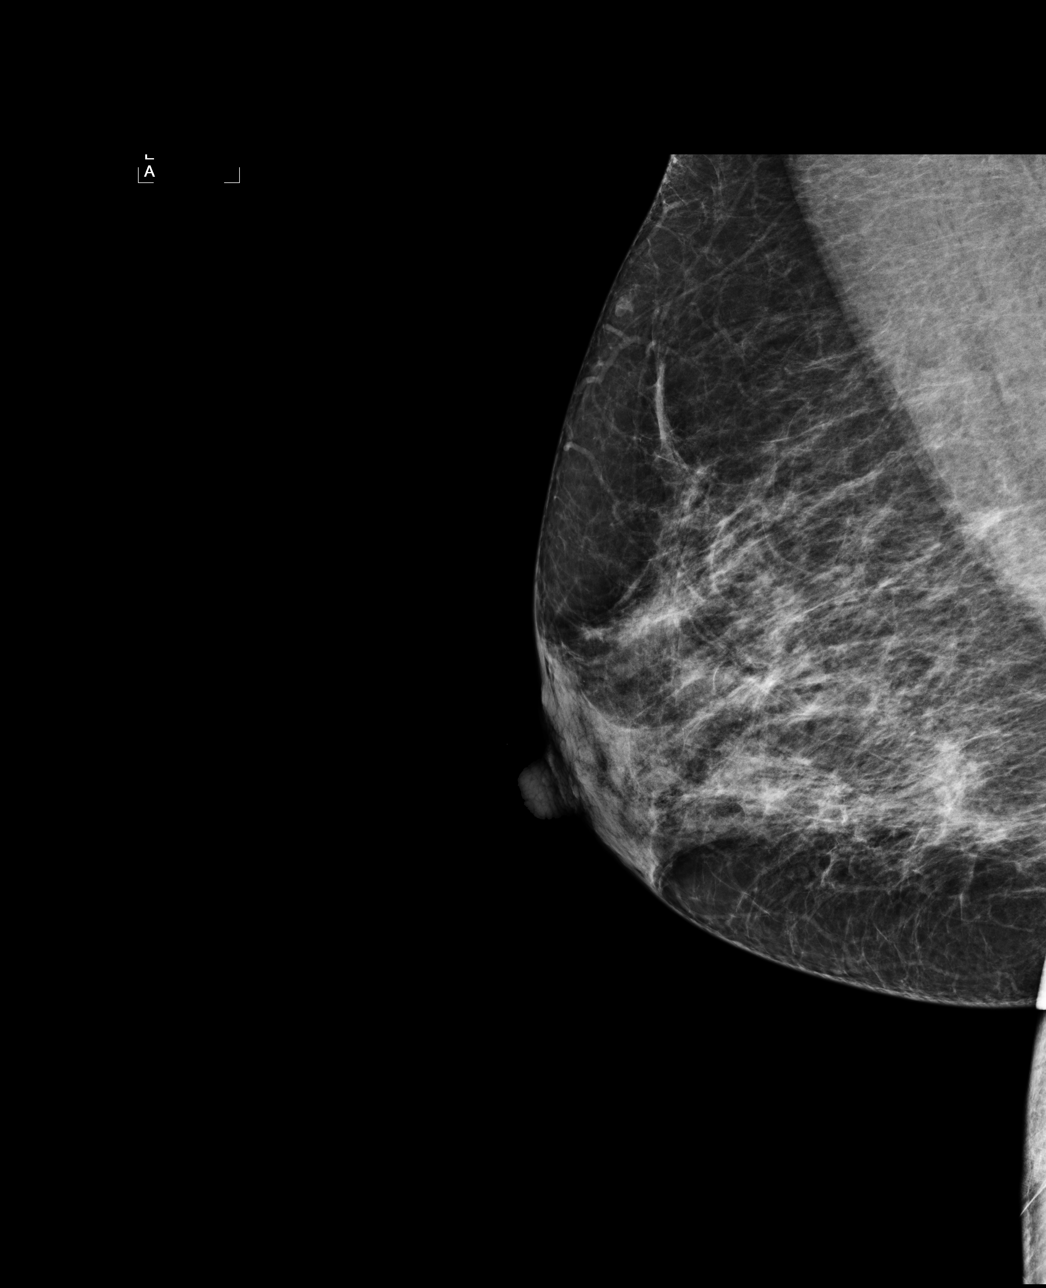

[4 of 4 positions shown; findings below may reference images not displayed]

ACR Breast Density Category c: The breast tissue is heterogeneously
dense, which may obscure small masses.
FINDINGS: There are no findings suspicious for malignancy. Images were
processed with CAD.
IMPRESSION: No mammographic evidence of malignancy. A result letter of this
screening mammogram will be mailed directly to the patient.

RECOMMENDATION:
Screening mammogram in one year. (Code:YJ-[id number])

BI-RADS CATEGORY  1: Negative.

## 2014-11-23 ENCOUNTER — Other Ambulatory Visit: Payer: Self-pay | Admitting: Orthopedic Surgery

## 2014-11-23 DIAGNOSIS — R52 Pain, unspecified: Secondary | ICD-10-CM

## 2014-12-01 ENCOUNTER — Ambulatory Visit
Admission: RE | Admit: 2014-12-01 | Discharge: 2014-12-01 | Disposition: A | Discharge status: Home / Self Care | Payer: Medicare Other | Source: Ambulatory Visit | Attending: Orthopedic Surgery | Admitting: Orthopedic Surgery

## 2014-12-01 DIAGNOSIS — R52 Pain, unspecified: Secondary | ICD-10-CM

## 2015-07-15 ENCOUNTER — Emergency Department (HOSPITAL_BASED_OUTPATIENT_CLINIC_OR_DEPARTMENT_OTHER)
Admission: EM | Admit: 2015-07-15 | Discharge: 2015-07-15 | Disposition: A | Discharge status: Home / Self Care | Payer: Medicare Other | Attending: Emergency Medicine | Admitting: Emergency Medicine

## 2015-07-15 ENCOUNTER — Emergency Department (HOSPITAL_BASED_OUTPATIENT_CLINIC_OR_DEPARTMENT_OTHER): Payer: Medicare Other

## 2015-07-15 ENCOUNTER — Encounter (HOSPITAL_BASED_OUTPATIENT_CLINIC_OR_DEPARTMENT_OTHER): Payer: Self-pay | Admitting: *Deleted

## 2015-07-15 DIAGNOSIS — J029 Acute pharyngitis, unspecified: Secondary | ICD-10-CM | POA: Diagnosis present

## 2015-07-15 DIAGNOSIS — Z79899 Other long term (current) drug therapy: Secondary | ICD-10-CM | POA: Insufficient documentation

## 2015-07-15 DIAGNOSIS — Z7982 Long term (current) use of aspirin: Secondary | ICD-10-CM | POA: Insufficient documentation

## 2015-07-15 DIAGNOSIS — I10 Essential (primary) hypertension: Secondary | ICD-10-CM | POA: Insufficient documentation

## 2015-07-15 DIAGNOSIS — J069 Acute upper respiratory infection, unspecified: Secondary | ICD-10-CM | POA: Diagnosis not present

## 2015-07-15 DIAGNOSIS — B9789 Other viral agents as the cause of diseases classified elsewhere: Secondary | ICD-10-CM

## 2015-07-15 LAB — RAPID STREP SCREEN (MED CTR MEBANE ONLY): Streptococcus, Group A Screen (Direct): NEGATIVE

## 2015-07-15 MED ORDER — ALBUTEROL SULFATE HFA 108 (90 BASE) MCG/ACT IN AERS
2.0000 | INHALATION_SPRAY | RESPIRATORY_TRACT | Status: DC | PRN
  Administered 2015-07-15: 2 via RESPIRATORY_TRACT
  Filled 2015-07-15: qty 6.7

## 2015-07-15 NOTE — Discharge Instructions (Signed)
Return to the ED with any concerns including difficulty breathing despite using albuterol every 4 hours, not drinking fluids, decreased urine output, vomiting and not able to keep down liquids or medications, decreased level of alertness/lethargy, or any other alarming symptoms °

## 2015-07-15 NOTE — ED Notes (Signed)
Pt c/o sore throat x 3 days

## 2015-07-15 NOTE — ED Provider Notes (Signed)
CSN: [id number]     Arrival date & time 07/15/15  1609 History   First MD Initiated Contact with Patient 07/15/15 1715     Chief Complaint  Patient presents with  . Sore Throat     (Consider location/radiation/quality/duration/timing/severity/associated sxs/prior Treatment) HPI  Pt presenting with c/o cough and sore throat for the past 3 days.  She has had some subjective fever.  She has been gargling with baking soda and hot water which has helped her throat pain somewhat.  No difficulty breathing.  Cough is productive of phlegm.  No specific sick contacts.  No recent travel.  No abdominal pain.  Continues to drink liquids well.  No chest pain.  There are no other associated systemic symptoms, there are no other alleviating or modifying factors.   Past Medical History  Diagnosis Date  . Hypertension    Past Surgical History  Procedure Laterality Date  . Toe surgery    . Appendectomy    . Cholecystectomy    . Knee arthroscopy     History reviewed. No pertinent family history. Social History  Substance Use Topics  . Smoking status: Never Smoker   . Smokeless tobacco: None  . Alcohol Use: No   OB History    No data available     Review of Systems  ROS reviewed and all otherwise negative except for mentioned in HPI    Allergies  Motrin  Home Medications   Prior to Admission medications   Medication Sig Start Date End Date Taking? Authorizing Provider  aspirin 81 MG tablet Take 81 mg by mouth daily.    Historical Provider, MD  chlorpheniramine-HYDROcodone (TUSSIONEX PENNKINETIC ER) 10-8 MG/5ML LQCR Take 5 mLs by mouth at bedtime as needed for cough. 06/14/13   Rolan Bucco, MD  Diltiazem HCl ER Beads (TIAZAC PO) Take by mouth.    Historical Provider, MD  doxycycline (VIBRAMYCIN) 100 MG capsule Take 1 capsule (100 mg total) by mouth 2 (two) times daily. One po bid x 7 days 06/14/13   Rolan Bucco, MD  RABEprazole (ACIPHEX) 20 MG tablet Take 20 mg by mouth daily.     Historical Provider, MD  RAMIPRIL PO Take by mouth.    Historical Provider, MD  rosuvastatin (CRESTOR) 10 MG tablet Take 10 mg by mouth daily.    Historical Provider, MD   BP 145/67 mmHg  Pulse 57  Temp(Src) 98.6 F (37 C) (Oral)  Resp 16  Ht  (1.575 m)  Wt 158 lb (71.668 kg)  BMI 28.89 kg/m2  SpO2 98%  Vitals reviewed Physical Exam  Physical Examination: General appearance - alert, well appearing, and in no distress Mental status - alert, oriented to person, place, and time Eyes - no conjunctival injection, no scleral icterus Mouth - mucous membranes moist, pharynx normal without lesions, OP with mild erythema, palate symmetric, uvula midline Neck - supple, no significant adenopathy Chest - clear to auscultation, no wheezes, rales or rhonchi, symmetric air entry Heart - normal rate, regular rhythm, normal S1, S2, no murmurs, rubs, clicks or gallops Neurological - alert, oriented, normal speech, awake, alert, normal gait Extremities - peripheral pulses normal, no pedal edema, no clubbing or cyanosis Skin - normal coloration and turgor, no rashes  ED Course  Procedures (including critical care time) Labs Review Labs Reviewed  RAPID STREP SCREEN (NOT AT [facility])  CULTURE, GROUP A STREP    Imaging Review Dg Chest 2 View  07/15/2015  CLINICAL DATA:  Sore throat for 3 days  and dry cough. EXAM: CHEST  2 VIEW COMPARISON:  06/14/2013 FINDINGS: Cardiomediastinal silhouette is normal. Mediastinal contours appear intact. There is no evidence of focal airspace consolidation, pleural effusion or pneumothorax. Osseous structures are without acute abnormality. Soft tissues are grossly normal. IMPRESSION: No active cardiopulmonary disease. Electronically Signed   By: Ted Mcalpineobrinka  Dimitrova M.D.   On: 07/15/2015 18:13   I have personally reviewed and evaluated these images and lab results as part of my medical decision-making.   EKG Interpretation None      MDM   Final diagnoses:   Viral URI with cough    Pt presenting with c/o cough and sore throat, rapid strep is negative.  CXR is reassuring as well.  Pt is well appearing.  Pt likely has a viral syndrome.  Discharged with strict return precautions.  Pt agreeable with plan.   Jerelyn ScottMartha Linker, MD 07/16/15 [id number]

## 2015-07-17 LAB — CULTURE, GROUP A STREP: Strep A Culture: NEGATIVE

## 2015-11-14 ENCOUNTER — Other Ambulatory Visit: Payer: Self-pay

## 2015-11-14 DIAGNOSIS — Z1231 Encounter for screening mammogram for malignant neoplasm of breast: Secondary | ICD-10-CM

## 2015-11-21 ENCOUNTER — Ambulatory Visit
Admission: RE | Admit: 2015-11-21 | Discharge: 2015-11-21 | Disposition: A | Discharge status: Home / Self Care | Payer: Medicare Other | Source: Ambulatory Visit

## 2015-11-21 DIAGNOSIS — Z1231 Encounter for screening mammogram for malignant neoplasm of breast: Secondary | ICD-10-CM

## 2016-12-26 ENCOUNTER — Other Ambulatory Visit: Payer: Self-pay | Admitting: Cardiology

## 2016-12-26 DIAGNOSIS — Z1231 Encounter for screening mammogram for malignant neoplasm of breast: Secondary | ICD-10-CM

## 2017-01-11 ENCOUNTER — Ambulatory Visit
Admission: RE | Admit: 2017-01-11 | Discharge: 2017-01-11 | Disposition: A | Discharge status: Home / Self Care | Payer: Medicare Other | Source: Ambulatory Visit | Attending: Cardiology | Admitting: Cardiology

## 2017-01-11 DIAGNOSIS — Z1231 Encounter for screening mammogram for malignant neoplasm of breast: Secondary | ICD-10-CM

## 2018-02-03 ENCOUNTER — Other Ambulatory Visit: Payer: Self-pay | Admitting: Cardiology

## 2018-02-03 DIAGNOSIS — Z1231 Encounter for screening mammogram for malignant neoplasm of breast: Secondary | ICD-10-CM

## 2018-02-06 ENCOUNTER — Ambulatory Visit
Admission: RE | Admit: 2018-02-06 | Discharge: 2018-02-06 | Disposition: A | Discharge status: Home / Self Care | Payer: Medicare Other | Source: Ambulatory Visit | Attending: Cardiology | Admitting: Cardiology

## 2018-02-06 DIAGNOSIS — Z1231 Encounter for screening mammogram for malignant neoplasm of breast: Secondary | ICD-10-CM

## 2018-02-12 ENCOUNTER — Other Ambulatory Visit: Payer: Self-pay | Admitting: Orthopedic Surgery

## 2018-02-25 ENCOUNTER — Encounter (HOSPITAL_BASED_OUTPATIENT_CLINIC_OR_DEPARTMENT_OTHER): Payer: Self-pay | Admitting: *Deleted

## 2018-03-02 ENCOUNTER — Encounter (HOSPITAL_BASED_OUTPATIENT_CLINIC_OR_DEPARTMENT_OTHER): Payer: Self-pay | Admitting: Emergency Medicine

## 2018-03-02 ENCOUNTER — Emergency Department (HOSPITAL_BASED_OUTPATIENT_CLINIC_OR_DEPARTMENT_OTHER)
Admission: EM | Admit: 2018-03-02 | Discharge: 2018-03-02 | Disposition: A | Discharge status: Home / Self Care | Payer: Medicare Other | Attending: Emergency Medicine | Admitting: Emergency Medicine

## 2018-03-02 ENCOUNTER — Emergency Department (HOSPITAL_BASED_OUTPATIENT_CLINIC_OR_DEPARTMENT_OTHER): Payer: Medicare Other

## 2018-03-02 ENCOUNTER — Other Ambulatory Visit: Payer: Self-pay

## 2018-03-02 DIAGNOSIS — Z79899 Other long term (current) drug therapy: Secondary | ICD-10-CM | POA: Insufficient documentation

## 2018-03-02 DIAGNOSIS — Z7982 Long term (current) use of aspirin: Secondary | ICD-10-CM | POA: Insufficient documentation

## 2018-03-02 DIAGNOSIS — R079 Chest pain, unspecified: Secondary | ICD-10-CM | POA: Diagnosis not present

## 2018-03-02 DIAGNOSIS — Z9049 Acquired absence of other specified parts of digestive tract: Secondary | ICD-10-CM | POA: Insufficient documentation

## 2018-03-02 DIAGNOSIS — I10 Essential (primary) hypertension: Secondary | ICD-10-CM | POA: Insufficient documentation

## 2018-03-02 LAB — CBC WITH DIFFERENTIAL/PLATELET
BASOS PCT: 0 %
Basophils Absolute: 0 10*3/uL (ref 0.0–0.1)
Eosinophils Absolute: 0.2 10*3/uL (ref 0.0–0.7)
Eosinophils Relative: 3 %
HEMATOCRIT: 41.8 % (ref 36.0–46.0)
HEMOGLOBIN: 14.1 g/dL (ref 12.0–15.0)
Lymphocytes Relative: 42 %
Lymphs Abs: 2.2 10*3/uL (ref 0.7–4.0)
MCH: 28.9 pg (ref 26.0–34.0)
MCHC: 33.7 g/dL (ref 30.0–36.0)
MCV: 85.7 fL (ref 78.0–100.0)
MONOS PCT: 6 %
Monocytes Absolute: 0.3 10*3/uL (ref 0.1–1.0)
NEUTROS ABS: 2.6 10*3/uL (ref 1.7–7.7)
NEUTROS PCT: 49 %
Platelets: 283 10*3/uL (ref 150–400)
RBC: 4.88 MIL/uL (ref 3.87–5.11)
RDW: 14.4 % (ref 11.5–15.5)
WBC: 5.3 10*3/uL (ref 4.0–10.5)

## 2018-03-02 LAB — BASIC METABOLIC PANEL
ANION GAP: 9 (ref 5–15)
BUN: 13 mg/dL (ref 8–23)
CHLORIDE: 106 mmol/L (ref 98–111)
CO2: 24 mmol/L (ref 22–32)
CREATININE: 0.84 mg/dL (ref 0.44–1.00)
Calcium: 9.3 mg/dL (ref 8.9–10.3)
GFR calc non Af Amer: >60 mL/min (ref >60)
Glucose, Bld: 119 mg/dL — ABNORMAL HIGH (ref 70–99)
POTASSIUM: 4 mmol/L (ref 3.5–5.1)
SODIUM: 139 mmol/L (ref 135–145)

## 2018-03-02 LAB — TROPONIN I

## 2018-03-02 NOTE — ED Provider Notes (Signed)
[facility] EMERGENCY DEPARTMENT Provider Note   CSN: [id number] Arrival date & time: 03/02/18  1104     History   Chief Complaint Chief Complaint  Patient presents with  . Gastroesophageal Reflux    HPI Theresa Moon is a 77 y.o. female.  HPI Patient presents with pain in her upper chest.  It is dull.  Is worse after eating.  States she ate some chicken and that tends to give her the pain.  No nausea or vomiting.  She is on some antiacid medicines.  No pain with exertion.  No fevers or chills.  Pain is dull.  Recently saw her cardiologist and states everything was fine. Past Medical History:  Diagnosis Date  . Hypertension     There are no active problems to display for this patient.   Past Surgical History:  Procedure Laterality Date  . APPENDECTOMY    . CHOLECYSTECTOMY    . KNEE ARTHROSCOPY    . TOE SURGERY       OB History   None      Home Medications    Prior to Admission medications   Medication Sig Start Date End Date Taking? Authorizing Provider  aspirin 81 MG tablet Take 81 mg by mouth daily.    [provider]  Diltiazem HCl ER Beads (TIAZAC PO) Take 240 mg by mouth.     [provider]  RABEprazole (ACIPHEX) 20 MG tablet Take 20 mg by mouth daily.    [provider]  RAMIPRIL PO Take 10 mg by mouth.     [provider]  rosuvastatin (CRESTOR) 10 MG tablet Take 10 mg by mouth daily.    [provider]  spironolactone (ALDACTONE) 25 MG tablet Take 25 mg by mouth daily.    [provider]    Family History History reviewed. No pertinent family history.  Social History Social History   Tobacco Use  . Smoking status: Never Smoker  . Smokeless tobacco: Never Used  Substance Use Topics  . Alcohol use: No  . Drug use: No     Allergies   Motrin [ibuprofen]   Review of Systems Review of Systems  Constitutional: Negative for appetite change.  HENT: Negative for congestion.    Respiratory: Negative for shortness of breath.   Cardiovascular: Positive for chest pain.  Gastrointestinal: Negative for abdominal pain.  Genitourinary: Negative for frequency.  Musculoskeletal: Negative for back pain.  Skin: Negative for rash.  Neurological: Negative for weakness.  Hematological: Negative for adenopathy.  Psychiatric/Behavioral: Negative for confusion.     Physical Exam Updated Vital Signs BP (!) 144/71 (BP Location: Right Arm)   Pulse 70   Temp 97.8 F (36.6 C) (Oral)   Resp 18   Ht 5' 2.5" (1.588 m)   Wt 72.6 kg   SpO2 99%   BMI 28.80 kg/m   Physical Exam  Constitutional: She appears well-developed.  HENT:  Head: Normocephalic.  Eyes: EOM are normal.  Neck: Neck supple.  Cardiovascular: Normal rate.  Pulmonary/Chest: Effort normal. She exhibits no tenderness.  Abdominal: Soft. There is no tenderness.  Musculoskeletal: She exhibits no edema.  Skin: Skin is warm. Capillary refill takes less than 2 seconds.     ED Treatments / Results  Labs (all labs ordered are listed, but only abnormal results are displayed) Labs Reviewed  BASIC METABOLIC PANEL - Abnormal; Notable for the following components:      Result Value   Glucose, Bld 119 (*)  All other components within normal limits  CBC WITH DIFFERENTIAL/PLATELET  TROPONIN I    EKG EKG Interpretation  Date/Time:  Sunday March 02 2018 11:15:21 EDT Ventricular Rate:  63 PR Interval:  176 QRS Duration: 82 QT Interval:  390 QTC Calculation: 399 R Axis:   -7 Text Interpretation:  Normal sinus rhythm with sinus arrhythmia Minimal voltage criteria for LVH, may be normal variant T wave abnormality, consider lateral ischemia Abnormal ECG Confirmed by Benjiman Core [phone]) on 03/02/2018 1:07:30 PM   Radiology Dg Chest 2 View  Result Date: 03/02/2018 CLINICAL DATA:  Progressively severe chest pain after eating. EXAM: CHEST - 2 VIEW COMPARISON:  07/15/2015. FINDINGS: Normal sized heart.  Tortuous and partially calcified thoracic aorta. Clear lungs with normal vascularity. Thoracic spine degenerative changes. Mild scoliosis. Cholecystectomy clips. IMPRESSION: No acute abnormality. Electronically Signed   By: Beckie Salts M.D.   On: 03/02/2018 12:03    Procedures Procedures (including critical care time)  Medications Ordered in ED Medications - No data to display   Initial Impression / Assessment and Plan / ED Course  I have reviewed the triage vital signs and the nursing notes.  Pertinent labs & imaging results that were available during my care of the patient were reviewed by me and considered in my medical decision making (see chart for details).     Patient with chest pain.  Mid chest.  Comes on after eating certain foods.  No abdominal pain.  EKG reassuring.  Enzymes negative.  Cardiac cause felt less likely.  Potential GI cause.  Has recently seen cardiology.  Will discharge home.  Final Clinical Impressions(s) / ED Diagnoses   Final diagnoses:  None    ED Discharge Orders    None       Benjiman Core, MD 03/02/18 1313

## 2018-03-02 NOTE — ED Triage Notes (Signed)
Patient states that she had had "peach cobbler, blueberry's and ham sandwich and rotisserie chicken and the acid in her chest got worse". Patient states that she get horrible acid in her chest after the chicken last night - PAtient reports that she has a lump in the middle of her throat that she could get rid of. At her cardiologist on Tue ans was told everything looked good

## 2018-03-02 NOTE — ED Notes (Signed)
ED Provider at bedside. 

## 2018-03-04 ENCOUNTER — Ambulatory Visit (HOSPITAL_BASED_OUTPATIENT_CLINIC_OR_DEPARTMENT_OTHER): Admission: RE | Admit: 2018-03-04 | Payer: Medicare Other | Source: Ambulatory Visit | Admitting: Orthopedic Surgery

## 2018-03-04 SURGERY — MINOR EXCISION OF MASS
Anesthesia: Regional | Laterality: Left

## 2019-02-02 ENCOUNTER — Other Ambulatory Visit: Payer: Self-pay | Admitting: Cardiology

## 2019-02-02 DIAGNOSIS — Z1231 Encounter for screening mammogram for malignant neoplasm of breast: Secondary | ICD-10-CM

## 2019-03-18 ENCOUNTER — Ambulatory Visit: Payer: Medicare Other

## 2019-04-10 ENCOUNTER — Other Ambulatory Visit: Payer: Self-pay

## 2019-04-10 ENCOUNTER — Ambulatory Visit
Admission: RE | Admit: 2019-04-10 | Discharge: 2019-04-10 | Disposition: A | Discharge status: Home / Self Care | Payer: Medicare Other | Source: Ambulatory Visit | Attending: Cardiology | Admitting: Cardiology

## 2019-04-10 DIAGNOSIS — Z1231 Encounter for screening mammogram for malignant neoplasm of breast: Secondary | ICD-10-CM

## 2019-09-20 ENCOUNTER — Ambulatory Visit: Payer: Medicare PPO | Attending: Internal Medicine

## 2019-09-20 DIAGNOSIS — Z23 Encounter for immunization: Secondary | ICD-10-CM | POA: Insufficient documentation

## 2019-09-20 NOTE — Progress Notes (Signed)
   Covid-19 Vaccination Clinic  Name:  KATELAND LEISINGER    MRN: [id number] DOB: [birth year]  09/20/2019  Ms. Krahn was observed post Covid-19 immunization for 15 minutes without incidence. She was provided with Vaccine Information Sheet and instruction to access the V-Safe system.   Ms. Decaire was instructed to call 911 with any severe reactions post vaccine: Marland Kitchen Difficulty breathing  . Swelling of your face and throat  . A fast heartbeat  . A bad rash all over your body  . Dizziness and weakness    Immunizations Administered    Name Date Dose VIS Date Route   Pfizer COVID-19 Vaccine 09/20/2019  8:29 AM 0.3 mL 07/03/2019 Intramuscular   Manufacturer: [employer], [employer]   Lot: [id number]   NDC: [address]-0

## 2019-10-14 ENCOUNTER — Ambulatory Visit: Payer: Medicare PPO | Attending: Internal Medicine

## 2019-10-14 DIAGNOSIS — Z23 Encounter for immunization: Secondary | ICD-10-CM

## 2019-10-14 NOTE — Progress Notes (Signed)
   Covid-19 Vaccination Clinic  Name:  KARNISHA LEFEBRE    MRN: [id number] DOB: Nov 24, 1940  10/14/2019  Ms. Vantol was observed post Covid-19 immunization for 15 minutes without incident. She was provided with Vaccine Information Sheet and instruction to access the V-Safe system.   Ms. Ostlund was instructed to call 911 with any severe reactions post vaccine: Marland Kitchen Difficulty breathing  . Swelling of face and throat  . A fast heartbeat  . A bad rash all over body  . Dizziness and weakness   Immunizations Administered    Name Date Dose VIS Date Route   Pfizer COVID-19 Vaccine 10/14/2019  8:10 AM 0.3 mL 07/03/2019 Intramuscular   Manufacturer: [employer], [employer]   Lot: [id number]   NDC: [address]-0

## 2020-03-01 ENCOUNTER — Other Ambulatory Visit: Payer: Self-pay | Admitting: Cardiology

## 2020-03-01 DIAGNOSIS — Z1231 Encounter for screening mammogram for malignant neoplasm of breast: Secondary | ICD-10-CM

## 2020-04-13 ENCOUNTER — Other Ambulatory Visit: Payer: Self-pay

## 2020-04-13 ENCOUNTER — Ambulatory Visit
Admission: RE | Admit: 2020-04-13 | Discharge: 2020-04-13 | Disposition: A | Discharge status: Home / Self Care | Payer: Medicare PPO | Source: Ambulatory Visit | Attending: Cardiology | Admitting: Cardiology

## 2020-04-13 DIAGNOSIS — Z1231 Encounter for screening mammogram for malignant neoplasm of breast: Secondary | ICD-10-CM

## 2020-04-25 ENCOUNTER — Other Ambulatory Visit (HOSPITAL_BASED_OUTPATIENT_CLINIC_OR_DEPARTMENT_OTHER): Payer: Self-pay | Admitting: Internal Medicine

## 2020-04-25 ENCOUNTER — Ambulatory Visit: Payer: Medicare PPO | Attending: Internal Medicine

## 2020-04-25 DIAGNOSIS — Z23 Encounter for immunization: Secondary | ICD-10-CM

## 2020-04-25 NOTE — Progress Notes (Signed)
   Covid-19 Vaccination Clinic  Name:  Theresa Moon    MRN: [id number] DOB: February 09, 1941  04/25/2020  [name] was observed post Covid-19 immunization for 15 minutes without incident. She was provided with Vaccine Information Sheet and instruction to access the V-Safe system.  Vaccinated by Theodis Sato  [name] was instructed to call 911 with any severe reactions post vaccine: Marland Kitchen Difficulty breathing  . Swelling of face and throat  . A fast heartbeat  . A bad rash all over body  . Dizziness and weakness

## 2020-04-29 MED FILL — PFIZER-BIONTECH COVID-19 VA: 30 | 1 days supply | Qty: 0 | Fill #0

## 2020-05-03 ENCOUNTER — Other Ambulatory Visit: Payer: Self-pay

## 2020-05-03 ENCOUNTER — Encounter: Payer: Self-pay | Admitting: Family Medicine

## 2020-05-03 ENCOUNTER — Ambulatory Visit (INDEPENDENT_AMBULATORY_CARE_PROVIDER_SITE_OTHER): Payer: Medicare PPO | Admitting: Family Medicine

## 2020-05-03 ENCOUNTER — Ambulatory Visit: Payer: Self-pay

## 2020-05-03 VITALS — Ht 63.0 in | Wt 155.0 lb

## 2020-05-03 DIAGNOSIS — M25511 Pain in right shoulder: Secondary | ICD-10-CM

## 2020-05-03 DIAGNOSIS — M12811 Other specific arthropathies, not elsewhere classified, right shoulder: Secondary | ICD-10-CM | POA: Diagnosis not present

## 2020-05-03 DIAGNOSIS — M19019 Primary osteoarthritis, unspecified shoulder: Secondary | ICD-10-CM | POA: Insufficient documentation

## 2020-05-03 MED ORDER — METHYLPREDNISOLONE ACETATE 40 MG/ML IJ SUSP
40.0000 mg | Freq: Once | INTRAMUSCULAR | Status: AC
  Administered 2020-05-03: 40 mg via INTRA_ARTICULAR

## 2020-05-03 NOTE — Assessment & Plan Note (Signed)
Pain is occurring acutely. Does have degenerative change appreciated on ultrasound. -Counseled on home exercise therapy and supportive care. -Injection. -Could consider imaging or physical therapy.

## 2020-05-03 NOTE — Progress Notes (Signed)
Theresa Moon - 79 y.o. female MRN [id number]  Date of birth: January 31, 1941  SUBJECTIVE:  Including CC & ROS.  Chief Complaint  Patient presents with  . Shoulder Pain    right    Theresa Moon is a 79 y.o. female that is presenting with acute right shoulder pain.  She had pain with lifting up in the window glass.  Since that time she has had pain with trying to comb her hair.  No history of surgery.  Pain is severe.   Review of Systems See HPI   HISTORY: Past Medical, Surgical, Social, and Family History Reviewed & Updated per EMR.   Pertinent Historical Findings include:  Past Medical History:  Diagnosis Date  . Hypertension     Past Surgical History:  Procedure Laterality Date  . APPENDECTOMY    . CHOLECYSTECTOMY    . KNEE ARTHROSCOPY    . TOE SURGERY      Family History  Problem Relation Age of Onset  . Breast cancer Neg Hx     Social History   Socioeconomic History  . Marital status: Single    Spouse name: Not on file  . Number of children: Not on file  . Years of education: Not on file  . Highest education level: Not on file  Occupational History  . Not on file  Tobacco Use  . Smoking status: Never Smoker  . Smokeless tobacco: Never Used  Substance and Sexual Activity  . Alcohol use: No  . Drug use: No  . Sexual activity: Not on file  Other Topics Concern  . Not on file  Social History Narrative  . Not on file   Social Determinants of Health   Financial Resource Strain:   . Difficulty of Paying Living Expenses: Not on file  Food Insecurity:   . Worried About Programme researcher, broadcasting/film/video in the Last Year: Not on file  . Ran Out of Food in the Last Year: Not on file  Transportation Needs:   . Lack of Transportation (Medical): Not on file  . Lack of Transportation (Non-Medical): Not on file  Physical Activity:   . Days of Exercise per Week: Not on file  . Minutes of Exercise per Session: Not on file  Stress:   . Feeling of Stress : Not on file    Social Connections:   . Frequency of Communication with Friends and Family: Not on file  . Frequency of Social Gatherings with Friends and Family: Not on file  . Attends Religious Services: Not on file  . Active Member of Clubs or Organizations: Not on file  . Attends Banker Meetings: Not on file  . Marital Status: Not on file  Intimate Partner Violence:   . Fear of Current or Ex-Partner: Not on file  . Emotionally Abused: Not on file  . Physically Abused: Not on file  . Sexually Abused: Not on file     PHYSICAL EXAM:  VS: Ht 5\' 3"  (1.6 m)   Wt 155 lb (70.3 kg)   BMI 27.46 kg/m  Physical Exam Gen: NAD, alert, cooperative with exam, well-appearing MSK:  Right shoulder: Pain with abduction passively. Normal internal and external rotation. Positive empty can test. Neurovascularly intact  Limited ultrasound: Right shoulder:  Degenerative changes of the biceps tendon with mild encircling effusion. Chronic changes of the supraspinatus with pain listed on abduction testing.  No increased hyperemia.  Summary: Chronic degenerative changes of the rotator cuff.  Ultrasound and interpretation  by Clare Gandy, MD   Aspiration/Injection Procedure Note ZABELLA WEASE 06/22/1941  Procedure: Injection Indications: Right shoulder pain  Procedure Details Consent: Risks of procedure as well as the alternatives and risks of each were explained to the (patient/caregiver).  Consent for procedure obtained. Time Out: Verified patient identification, verified procedure, site/side was marked, verified correct patient position, special equipment/implants available, medications/allergies/relevent history reviewed, required imaging and test results available.  Performed.  The area was cleaned with iodine and alcohol swabs.    The right subacromial space was injected using 1 cc's of 40 mg Depo-Medrol and 4 cc's of 0.25% bupivacaine with a 22 1 1/2" needle.  Ultrasound was  used. Images were obtained in long views showing the injection.     A sterile dressing was applied.  Patient did tolerate procedure well.    ASSESSMENT & PLAN:   Rotator cuff arthropathy of right shoulder Pain is occurring acutely. Does have degenerative change appreciated on ultrasound. -Counseled on home exercise therapy and supportive care. -Injection. -Could consider imaging or physical therapy.

## 2020-05-03 NOTE — Patient Instructions (Signed)
Nice to meet you Please try ice  Please try the exercises   Please send me a message in MyChart with any questions or updates.  Please see me back in 4 weeks.   --Dr. Yanisa Goodgame  

## 2020-05-31 ENCOUNTER — Encounter: Payer: Self-pay | Admitting: Family Medicine

## 2020-05-31 ENCOUNTER — Ambulatory Visit: Payer: Medicare PPO | Admitting: Family Medicine

## 2020-05-31 ENCOUNTER — Ambulatory Visit: Payer: Self-pay

## 2020-05-31 ENCOUNTER — Ambulatory Visit (HOSPITAL_BASED_OUTPATIENT_CLINIC_OR_DEPARTMENT_OTHER)
Admission: RE | Admit: 2020-05-31 | Discharge: 2020-05-31 | Disposition: A | Discharge status: Home / Self Care | Payer: Medicare PPO | Source: Ambulatory Visit | Attending: Family Medicine | Admitting: Family Medicine

## 2020-05-31 ENCOUNTER — Other Ambulatory Visit: Payer: Self-pay

## 2020-05-31 DIAGNOSIS — M7501 Adhesive capsulitis of right shoulder: Secondary | ICD-10-CM

## 2020-05-31 DIAGNOSIS — M19011 Primary osteoarthritis, right shoulder: Secondary | ICD-10-CM

## 2020-05-31 MED ORDER — TRIAMCINOLONE ACETONIDE 40 MG/ML IJ SUSP
40.0000 mg | Freq: Once | INTRAMUSCULAR | Status: AC
  Administered 2020-05-31: 40 mg via INTRA_ARTICULAR

## 2020-05-31 NOTE — Addendum Note (Signed)
Addended by: Kathi Simpers F on: 05/31/2020 09:03 AM   Modules accepted: Orders

## 2020-05-31 NOTE — Progress Notes (Signed)
Theresa Moon - 79 y.o. female MRN [id number]  Date of birth: 06/03/1941  SUBJECTIVE:  Including CC & ROS.  Chief Complaint  Patient presents with  . Follow-up    right shoulder    Theresa Moon is a 79 y.o. female that is presenting with worsening right shoulder pain.  She little improvement from subacromial injection.  She is having limitations in her range of motion.  Unable to do her normal daily activities.   Review of Systems See HPI   HISTORY: Past Medical, Surgical, Social, and Family History Reviewed & Updated per EMR.   Pertinent Historical Findings include:  Past Medical History:  Diagnosis Date  . Hypertension     Past Surgical History:  Procedure Laterality Date  . APPENDECTOMY    . CHOLECYSTECTOMY    . KNEE ARTHROSCOPY    . TOE SURGERY      Family History  Problem Relation Age of Onset  . Breast cancer Neg Hx     Social History   Socioeconomic History  . Marital status: Single    Spouse name: Not on file  . Number of children: Not on file  . Years of education: Not on file  . Highest education level: Not on file  Occupational History  . Not on file  Tobacco Use  . Smoking status: Never Smoker  . Smokeless tobacco: Never Used  Substance and Sexual Activity  . Alcohol use: No  . Drug use: No  . Sexual activity: Not on file  Other Topics Concern  . Not on file  Social History Narrative  . Not on file   Social Determinants of Health   Financial Resource Strain:   . Difficulty of Paying Living Expenses: Not on file  Food Insecurity:   . Worried About Programme researcher, broadcasting/film/video in the Last Year: Not on file  . Ran Out of Food in the Last Year: Not on file  Transportation Needs:   . Lack of Transportation (Medical): Not on file  . Lack of Transportation (Non-Medical): Not on file  Physical Activity:   . Days of Exercise per Week: Not on file  . Minutes of Exercise per Session: Not on file  Stress:   . Feeling of Stress : Not on file    Social Connections:   . Frequency of Communication with Friends and Family: Not on file  . Frequency of Social Gatherings with Friends and Family: Not on file  . Attends Religious Services: Not on file  . Active Member of Clubs or Organizations: Not on file  . Attends Banker Meetings: Not on file  . Marital Status: Not on file  Intimate Partner Violence:   . Fear of Current or Ex-Partner: Not on file  . Emotionally Abused: Not on file  . Physically Abused: Not on file  . Sexually Abused: Not on file     PHYSICAL EXAM:  VS: BP (!) 153/83   Pulse (!) 50   Ht 5\' 3"  (1.6 m)   Wt 157 lb (71.2 kg)   BMI 27.81 kg/m  Physical Exam Gen: NAD, alert, cooperative with exam, well-appearing MSK:  Right shoulder: Limited abduction and external rotation. Limited flexion. Normal internal rotation. Normal strength resistance. Neurovascular intact   Aspiration/Injection Procedure Note DORLA GUIZAR [birth year]  Procedure: Injection Indications: Right shoulder pain  Procedure Details Consent: Risks of procedure as well as the alternatives and risks of each were explained to the (patient/caregiver).  Consent for procedure obtained. Time  Out: Verified patient identification, verified procedure, site/side was marked, verified correct patient position, special equipment/implants available, medications/allergies/relevent history reviewed, required imaging and test results available.  Performed.  The area was cleaned with iodine and alcohol swabs.    The right glenohumeral joint was injected using 3 cc of 1% lidocaine and 1 cc's of 40 mg Kenalog and 3 cc's of 0.25% bupivacaine with a 22 3 1/2" needle.  Ultrasound was used. Images were obtained in short views showing the injection.     A sterile dressing was applied.  Patient did tolerate procedure well.     ASSESSMENT & PLAN:   OA (osteoarthritis) of shoulder Having more range of motion issues. Limited improvement  with subacromial injection.  Has an effusion noted in the posterior glenohumeral joint.  Likely that underlying degenerative changes. -Counseled on home exercise therapy and supportive care. -Injection. -X-ray -Could consider physical therapy.

## 2020-05-31 NOTE — Patient Instructions (Signed)
Good to see you Please try heat before exercises and ice after  Please work on the range of motion movements   Please send me a message in MyChart with any questions or updates.  Please see me back in 4 weeks.   --Dr. Jordan Likes

## 2020-05-31 NOTE — Assessment & Plan Note (Addendum)
Having more range of motion issues. Limited improvement with subacromial injection.  Has an effusion noted in the posterior glenohumeral joint.  Likely that underlying degenerative changes. -Counseled on home exercise therapy and supportive care. -Injection. -X-ray -Could consider physical therapy.

## 2020-06-01 ENCOUNTER — Telehealth: Payer: Self-pay | Admitting: Family Medicine

## 2020-06-01 NOTE — Telephone Encounter (Signed)
Unable to leave VM for patient. If she calls back please have her speak with a nurse/CMA and inform that her shoulder has degenerative changes on the xray. This is likely the source of her pain.   If any questions then please take the best time and phone number to call and I will try to call her back.   Myra Rude, MD [facility] 06/01/2020, 8:33 AM

## 2020-07-05 ENCOUNTER — Encounter: Payer: Self-pay | Admitting: Family Medicine

## 2020-07-05 ENCOUNTER — Other Ambulatory Visit: Payer: Self-pay

## 2020-07-05 ENCOUNTER — Ambulatory Visit: Payer: Medicare PPO | Admitting: Family Medicine

## 2020-07-05 DIAGNOSIS — M19011 Primary osteoarthritis, right shoulder: Secondary | ICD-10-CM

## 2020-07-05 MED ORDER — PREDNISONE 5 MG PO TABS: ORAL_TABLET | ORAL | 0 refills | Status: AC

## 2020-07-05 NOTE — Patient Instructions (Signed)
Good to see you Please try heat before exercise and ice after  Physical therapy will give you a call.  Please take tylenol three times a day as needed  You can use the pennsaid after the prednisone   Please send me a message in MyChart with any questions or updates.  Please see me back in 4-6 weeks.   --Dr. Jordan Likes

## 2020-07-05 NOTE — Assessment & Plan Note (Addendum)
Acute wornseing of her pain. Did get significant reflief with the GH injection.  -Counseled on home exercise therapy and supportive care. -Prednisone. -Provided Pennsaid samples. -Discussed naproxen but has reported anaphylaxis to ibuprofen. -Referral to physical therapy.

## 2020-07-05 NOTE — Progress Notes (Signed)
  Theresa Moon - 79 y.o. female MRN [id number]  Date of birth: [birth year]  SUBJECTIVE:  Including CC & ROS.  Chief Complaint  Patient presents with  . Follow-up    Right shoulder    Theresa Moon is a 79 y.o. female that is presenting with acute worsening of her right shoulder pain.  She got significant relief with the injection but the pain has returned.  She has limitations in her range of motion.  Has been taking Tylenol.  Denies any injury in the interim..  Independent review of the right shoulder x-ray shows degenerative changes of glenohumeral joint.   Review of Systems See HPI   HISTORY: Past Medical, Surgical, Social, and Family History Reviewed & Updated per EMR.   Pertinent Historical Findings include:  Past Medical History:  Diagnosis Date  . Hypertension     Past Surgical History:  Procedure Laterality Date  . APPENDECTOMY    . CHOLECYSTECTOMY    . KNEE ARTHROSCOPY    . TOE SURGERY      Family History  Problem Relation Age of Onset  . Breast cancer Neg Hx     Social History   Socioeconomic History  . Marital status: Single    Spouse name: Not on file  . Number of children: Not on file  . Years of education: Not on file  . Highest education level: Not on file  Occupational History  . Not on file  Tobacco Use  . Smoking status: Never Smoker  . Smokeless tobacco: Never Used  Substance and Sexual Activity  . Alcohol use: No  . Drug use: No  . Sexual activity: Not on file  Other Topics Concern  . Not on file  Social History Narrative  . Not on file   Social Determinants of Health   Financial Resource Strain: Not on file  Food Insecurity: Not on file  Transportation Needs: Not on file  Physical Activity: Not on file  Stress: Not on file  Social Connections: Not on file  Intimate Partner Violence: Not on file     PHYSICAL EXAM:  VS: BP (!) 160/86   Pulse (!) 57   Ht 5\' 2"  (1.575 m)   Wt 158 lb (71.7 kg)   BMI 28.90 kg/m   Physical Exam Gen: NAD, alert, cooperative with exam, well-appearing MSK:  Right shoulder: Limitation of external rotation. Limited flexion. Neurovascular intact     ASSESSMENT & PLAN:   OA (osteoarthritis) of shoulder Acute wornseing of her pain. Did get significant reflief with the GH injection.  -Counseled on home exercise therapy and supportive care. -Prednisone. -Provided Pennsaid samples. -Discussed naproxen but has reported anaphylaxis to ibuprofen. -Referral to physical therapy.

## 2020-07-05 NOTE — Progress Notes (Signed)
Medication Samples have been provided to the patient.  Drug name: Pennsaid       Strength: 2%        Qty: 2 boxes LOT: [id number]  Exp.Date: 11/2020  Dosing instructions: Use a pea size amount and rub gently.  The patient has been instructed regarding the correct time, dose, and frequency of taking this medication, including desired effects and most common side effects.   Kathi Simpers, [place] 8:28 AM 07/05/2020

## 2020-08-04 ENCOUNTER — Other Ambulatory Visit: Payer: Self-pay

## 2020-08-04 ENCOUNTER — Encounter: Payer: Self-pay | Admitting: Physical Therapy

## 2020-08-04 ENCOUNTER — Ambulatory Visit: Payer: Medicare PPO | Attending: Family Medicine | Admitting: Physical Therapy

## 2020-08-04 DIAGNOSIS — M25511 Pain in right shoulder: Secondary | ICD-10-CM

## 2020-08-04 DIAGNOSIS — M25611 Stiffness of right shoulder, not elsewhere classified: Secondary | ICD-10-CM

## 2020-08-04 DIAGNOSIS — R293 Abnormal posture: Secondary | ICD-10-CM | POA: Diagnosis present

## 2020-08-04 DIAGNOSIS — M6281 Muscle weakness (generalized): Secondary | ICD-10-CM | POA: Diagnosis present

## 2020-08-04 NOTE — Therapy (Addendum)
[facility] [address]  [place], [place], [address] Phone: [phone]   Fax:  [phone]  Physical Therapy Evaluation  Patient Details  Name: Theresa Moon MRN: [id number] Date of Birth: January 23, 1941 Referring Provider (PT): Clearance Coots, MD    Progress Note Reporting Period 08/04/20 to 08/04/20  See note below for Objective Data and Assessment of Progress/Goals.     Encounter Date: 08/04/2020   PT End of Session - 08/04/20 0847    Visit Number 1    Number of Visits 9    Date for PT Re-Evaluation 09/29/20    Authorization Type [employer]    PT Start Time 0802    PT Stop Time 0839    PT Time Calculation (min) 37 min    Activity Tolerance Patient tolerated treatment well;Patient limited by pain    Behavior During Therapy [facility] for tasks assessed/performed           Past Medical History:  Diagnosis Date  . Hypertension     Past Surgical History:  Procedure Laterality Date  . APPENDECTOMY    . CHOLECYSTECTOMY    . KNEE ARTHROSCOPY    . TOE SURGERY      There were no vitals filed for this visit.    Subjective Assessment - 08/04/20 0803    Subjective Patient reports a longstanding issue with her R shoulder, which has improved with therapy in the past. Recent episode of R shoulder pain has been ongoing for the past 2 months. Pain is located in the shoulder joint. Has improved with meds, however still having difficulty lifting her arm above her head, reaching behind the back and performing toilet hygiene, lifting heavy objects, and laying on the R side. Denies N/T or radiation.    Pertinent History HTN, knee arthroscopy, toe surgery    Limitations House hold activities;Lifting    Diagnostic tests 05/31/20 R shoulder xray: Moderate degenerative change of the right glenohumeral and acromioclavicular joints.    Patient Stated Goals decrease pain    Currently in Pain? Yes    Pain Score 6      Pain Location Shoulder    Pain Orientation Right    Pain Descriptors / Indicators Dull    Pain Type Chronic pain;Acute pain              [facility] PT Assessment - 08/04/20 0807      Assessment   Medical Diagnosis Primary OA of R shoulder    Referring Provider (PT) Clearance Coots, MD    Onset Date/Surgical Date 06/04/20    Hand Dominance Right    Next MD Visit 08/08/20    Prior Therapy yes- for shoulder      Balance Screen   Has the patient fallen in the past 6 months No    Has the patient had a decrease in activity level because of a fear of falling?  No    Is the patient reluctant to leave their home because of a fear of falling?  No      Home Environment   Living Environment Private residence    Living Arrangements Alone    Available Help at Discharge Family;Friend(s)    Type of [place] Part time employment    Vocation Requirements drives activity bus for [employer]  Overall Cognitive Status Within Functional Limits for tasks assessed      Sensation   Light Touch Appears Intact      Coordination   Gross Motor Movements are Fluid and Coordinated Yes      Posture/Postural Control   Posture/Postural Control Postural limitations    Postural Limitations Rounded Shoulders;Forward head    Posture Comments increased cervical lordosis      ROM / Strength   AROM / PROM / Strength AROM;PROM;Strength      AROM   AROM Assessment Site Shoulder    Right/Left Shoulder Right;Left    Right Shoulder Flexion 113 Degrees   pain   Right Shoulder ABduction 55 Degrees   pain   Right Shoulder Internal Rotation --   FIR to inferior buttock   Right Shoulder External Rotation --   FER to forehead   Left Shoulder Flexion 144 Degrees    Left Shoulder ABduction 151 Degrees    Left Shoulder Internal Rotation --   FIR T10   Left Shoulder External Rotation --   FER T2      PROM   PROM Assessment Site Shoulder    Right/Left Shoulder Right    Right Shoulder Flexion 158 Degrees   pain   Right Shoulder ABduction 77 Degrees   pain   Right Shoulder Internal Rotation 56 Degrees   pain   Right Shoulder External Rotation 14 Degrees   pain     Strength   Strength Assessment Site Shoulder    Right/Left Shoulder Right;Left    Right Shoulder Flexion 4-/5   pain   Right Shoulder ABduction 4/5   pain   Right Shoulder Internal Rotation 4-/5   pain   Right Shoulder External Rotation 4-/5   pain   Left Shoulder Flexion 4+/5    Left Shoulder ABduction 4+/5    Left Shoulder Internal Rotation 4/5    Left Shoulder External Rotation 4/5      Palpation   Palpation comment no TTP over R shoulder                      Objective measurements completed on examination: See above findings.               PT Education - 08/04/20 0847    Education Details prognosis, POC, HEP    Person(s) Educated Patient    Methods Explanation;Demonstration;Tactile cues;Verbal cues;Handout    Comprehension Returned demonstration;Verbalized understanding            PT Short Term Goals - 08/04/20 2952      PT SHORT TERM GOAL #1   Title Patient to be independent with initial HEP.    Time 3    Period Weeks    Status New    Target Date 08/25/20             PT Long Term Goals - 08/04/20 0853      PT LONG TERM GOAL #1   Title Patient to be independent with advanced HEP.    Time 8    Period Weeks    Status New    Target Date 09/29/20      PT LONG TERM GOAL #2   Title Patient to demonstrate R shoulder AROM and PROM WFL and without pain limiting.    Time 8    Period Weeks    Status New    Target Date 09/29/20      PT LONG TERM GOAL #3   Title  Patient to demonstrate R UE strength >/=4+/5.    Time 8    Period Weeks    Status New    Target Date 09/29/20      PT LONG TERM GOAL #4   Title Patient to report 75% improvement in ability to reach  behind the back in order to improve ease of dressing and toilet hygiene.    Time 8    Period Weeks    Status New    Target Date 09/29/20      PT LONG TERM GOAL #5   Title Patient to report tolerance of R sidelying without pain limiting.    Time 8    Period Weeks    Status New    Target Date 09/29/20                  Plan - 08/04/20 0848    Clinical Impression Statement Patient is an 80y/o F presenting to OPPT with c/o acute on chronic R shoulder pain and stiffness for the past 2 months. Patient localizes pain to diffusely over the glenohumeral joint. Worse with overhead lifting, reaching behind the back, R sidelying, and lifting heavy objects. Denies N/T or radiation. Patient today presenting with rounded shoulders and forward head posture, limited and painful R shoulder AROM and PROM, and decreased R shoulder strength. Patient was educated on gentle AAROM and postural correction HEP- patient reported understanding. Would benefit from skilled PT services 1x/week for 8 weeks to address aforementioned impairments.    Personal Factors and Comorbidities Age;Comorbidity 3+;Time since onset of injury/illness/exacerbation;Fitness;Past/Current Experience;Profession    Comorbidities HTN, knee arthroscopy, toe surgery    Examination-Activity Limitations Sleep;Bed Mobility;Carry;Toileting;Dressing;Transfers;Hygiene/Grooming;Lift;Reach Overhead;Self Feeding    Examination-Participation Restrictions Cleaning;Shop;Community Activity;Driving;Yard Work;Laundry;Meal Prep;Occupation    Stability/Clinical Decision Making Stable/Uncomplicated    Clinical Decision Making Low    Rehab Potential Good    PT Frequency 1x / week    PT Duration 8 weeks    PT Treatment/Interventions ADLs/Self Care Home Management;Cryotherapy;Electrical Stimulation;Iontophoresis 4mg /ml Dexamethasone;Moist Heat;Therapeutic exercise;Therapeutic activities;Functional mobility training;Ultrasound;Neuromuscular  re-education;Patient/family education;Manual techniques;Vasopneumatic Device;Taping;Energy conservation;Dry needling;Passive range of motion    PT Next Visit Plan shoulder FOTO; progress shoulder AAROM/PROM and AROM to tolerance; gentle strengthening and postural correction ther-ex    Consulted and Agree with Plan of Care Patient           Patient will benefit from skilled therapeutic intervention in order to improve the following deficits and impairments:  Hypomobility,Decreased activity tolerance,Decreased strength,Increased fascial restricitons,Impaired UE functional use,Pain,Increased muscle spasms,Improper body mechanics,Decreased range of motion,Impaired flexibility,Postural dysfunction  Visit Diagnosis: Acute pain of right shoulder  Stiffness of right shoulder, not elsewhere classified  Muscle weakness (generalized)  Abnormal posture     Problem List Patient Active Problem List   Diagnosis Date Noted  . OA (osteoarthritis) of shoulder 05/03/2020      Janene Harvey, PT, DPT 08/04/20 8:57 AM   [facility] [address]  [place], [place], [address] Phone: [phone]   Fax:  [phone]  Name: Theresa Moon MRN: [id number] Date of Birth: [birth year]   PHYSICAL THERAPY DISCHARGE SUMMARY  Visits from Start of Care: 1  Current functional level related to goals / functional outcomes: See above clinical impression; patient did not return   Remaining deficits: See above   Education / Equipment: HEP  Plan: Patient agrees to discharge.  Patient goals were not met. Patient is being discharged due to not returning since the last visit.  ?????  Janene Harvey, PT, DPT 09/08/20 1:48 PM

## 2020-08-08 ENCOUNTER — Ambulatory Visit: Payer: Medicare PPO | Admitting: Family Medicine

## 2020-08-10 ENCOUNTER — Ambulatory Visit: Payer: Medicare PPO | Admitting: Family Medicine

## 2020-08-12 ENCOUNTER — Ambulatory Visit: Payer: Medicare PPO | Admitting: Physical Therapy

## 2020-08-15 ENCOUNTER — Ambulatory Visit: Payer: Medicare PPO | Admitting: Physical Therapy

## 2020-11-09 ENCOUNTER — Ambulatory Visit: Payer: Medicare PPO | Attending: Internal Medicine

## 2020-11-09 DIAGNOSIS — Z23 Encounter for immunization: Secondary | ICD-10-CM

## 2020-11-09 NOTE — Progress Notes (Signed)
   Covid-19 Vaccination Clinic  Name:  Theresa Moon    MRN: [id number] DOB: February 02, 1941  11/09/2020  [name] was observed post Covid-19 immunization for 15 minutes without incident. She was provided with Vaccine Information Sheet and instruction to access the V-Safe system.   [name] was instructed to call 911 with any severe reactions post vaccine: Marland Kitchen Difficulty breathing  . Swelling of face and throat  . A fast heartbeat  . A bad rash all over body  . Dizziness and weakness   Immunizations Administered    Name Date Dose VIS Date Route   PFIZER Comrnaty(Gray TOP) Covid-19 Vaccine 11/09/2020 11:53 AM 0.3 mL 06/30/2020 Intramuscular   Manufacturer: [employer], [employer]   Lot: [id number]   NDC: [phone]

## 2020-11-11 ENCOUNTER — Other Ambulatory Visit (HOSPITAL_BASED_OUTPATIENT_CLINIC_OR_DEPARTMENT_OTHER): Payer: Self-pay

## 2020-11-11 MED ORDER — PFIZER-BIONT COVID-19 VAC-TRIS 30 MCG/0.3ML IM SUSP
INTRAMUSCULAR | 0 refills | Status: AC
  Filled 2020-11-11: qty 0.3, 1d supply, fill #0

## 2021-03-16 ENCOUNTER — Other Ambulatory Visit: Payer: Self-pay | Admitting: Cardiology

## 2021-03-16 DIAGNOSIS — Z1231 Encounter for screening mammogram for malignant neoplasm of breast: Secondary | ICD-10-CM

## 2021-04-17 ENCOUNTER — Ambulatory Visit: Payer: Medicare PPO | Attending: Internal Medicine

## 2021-04-17 ENCOUNTER — Other Ambulatory Visit (HOSPITAL_BASED_OUTPATIENT_CLINIC_OR_DEPARTMENT_OTHER): Payer: Self-pay

## 2021-04-17 DIAGNOSIS — Z23 Encounter for immunization: Secondary | ICD-10-CM

## 2021-04-17 MED ORDER — INFLUENZA VAC A&B SA ADJ QUAD 0.5 ML IM PRSY
PREFILLED_SYRINGE | INTRAMUSCULAR | 0 refills | Status: AC
  Filled 2021-04-17: qty 0.5, 1d supply, fill #0

## 2021-04-17 NOTE — Progress Notes (Signed)
   Covid-19 Vaccination Clinic  Name:  Theresa Moon    MRN: [id number] DOB: 1940-09-17  04/17/2021  Theresa Moon was observed post Covid-19 immunization for 15 minutes without incident. She was provided with Vaccine Information Sheet and instruction to access the V-Safe system.   Theresa Moon was instructed to call 911 with any severe reactions post vaccine: Difficulty breathing  Swelling of face and throat  A fast heartbeat  A bad rash all over body  Dizziness and weakness

## 2021-04-19 ENCOUNTER — Ambulatory Visit: Payer: Medicare PPO

## 2021-04-24 ENCOUNTER — Other Ambulatory Visit (HOSPITAL_BASED_OUTPATIENT_CLINIC_OR_DEPARTMENT_OTHER): Payer: Self-pay

## 2021-04-24 MED ORDER — COVID-19MRNA BIVAL VACC PFIZER 30 MCG/0.3ML IM SUSP
INTRAMUSCULAR | 0 refills | Status: AC
  Filled 2021-04-24: qty 0.3, 1d supply, fill #0

## 2021-05-23 ENCOUNTER — Ambulatory Visit: Payer: Medicare PPO

## 2021-05-25 ENCOUNTER — Ambulatory Visit
Admission: RE | Admit: 2021-05-25 | Discharge: 2021-05-25 | Disposition: A | Discharge status: Home / Self Care | Payer: Medicare PPO | Source: Ambulatory Visit | Attending: Cardiology | Admitting: Cardiology

## 2021-05-25 ENCOUNTER — Other Ambulatory Visit: Payer: Self-pay

## 2021-05-25 DIAGNOSIS — Z1231 Encounter for screening mammogram for malignant neoplasm of breast: Secondary | ICD-10-CM

## 2022-04-23 ENCOUNTER — Other Ambulatory Visit: Payer: Self-pay | Admitting: Cardiology

## 2022-04-23 DIAGNOSIS — Z1231 Encounter for screening mammogram for malignant neoplasm of breast: Secondary | ICD-10-CM

## 2022-05-04 ENCOUNTER — Other Ambulatory Visit (HOSPITAL_BASED_OUTPATIENT_CLINIC_OR_DEPARTMENT_OTHER): Payer: Self-pay

## 2022-05-04 MED ORDER — COMIRNATY 30 MCG/0.3ML IM SUSP
INTRAMUSCULAR | 0 refills | Status: AC
  Filled 2022-05-04: qty 0.3, 1d supply, fill #0

## 2022-05-28 ENCOUNTER — Ambulatory Visit
Admission: RE | Admit: 2022-05-28 | Discharge: 2022-05-28 | Disposition: A | Discharge status: Home / Self Care | Payer: Medicare PPO | Source: Ambulatory Visit | Attending: Cardiology | Admitting: Cardiology

## 2022-05-28 DIAGNOSIS — Z1231 Encounter for screening mammogram for malignant neoplasm of breast: Secondary | ICD-10-CM

## 2022-08-29 DIAGNOSIS — I10 Essential (primary) hypertension: Secondary | ICD-10-CM | POA: Diagnosis not present

## 2022-08-29 DIAGNOSIS — E785 Hyperlipidemia, unspecified: Secondary | ICD-10-CM | POA: Diagnosis not present

## 2022-08-29 DIAGNOSIS — R7303 Prediabetes: Secondary | ICD-10-CM | POA: Diagnosis not present

## 2022-08-29 DIAGNOSIS — I2510 Atherosclerotic heart disease of native coronary artery without angina pectoris: Secondary | ICD-10-CM | POA: Diagnosis not present

## 2022-10-18 LAB — AMB RESULTS CONSOLE CBG: Glucose: 128

## 2022-10-18 NOTE — Progress Notes (Signed)
The patient did not have any SDOH need at this time.

## 2022-10-30 ENCOUNTER — Encounter: Payer: Self-pay | Admitting: *Deleted

## 2022-10-30 NOTE — Progress Notes (Addendum)
Pt attended 10/18/22 screening event and screening results wnl. Pt states on phone call that Dr. Sharyn Lull is her cardiologist and is serving as her "regular dr" because he helps her control her hypertension.Pt has seen Dr. Sharyn Lull w/in the last rolling 12 month (last ov 08/29/22). Her insurance has sent her a list of PCP w/in network and she states she may eventually look for a "regular dr" now she is no longer working or on such a tight schedule. And she would then check in network and choose a female dr. Pt denied and SDOH insecurities or barriers to health care access at the event and during the call. No additional health equity team support indicated at this time.

## 2022-11-07 ENCOUNTER — Encounter: Payer: Self-pay | Admitting: *Deleted

## 2022-11-23 DIAGNOSIS — K219 Gastro-esophageal reflux disease without esophagitis: Secondary | ICD-10-CM | POA: Diagnosis not present

## 2022-11-28 DIAGNOSIS — I10 Essential (primary) hypertension: Secondary | ICD-10-CM | POA: Diagnosis not present

## 2022-11-28 DIAGNOSIS — R7303 Prediabetes: Secondary | ICD-10-CM | POA: Diagnosis not present

## 2022-11-28 DIAGNOSIS — E782 Mixed hyperlipidemia: Secondary | ICD-10-CM | POA: Diagnosis not present

## 2022-11-28 DIAGNOSIS — I2510 Atherosclerotic heart disease of native coronary artery without angina pectoris: Secondary | ICD-10-CM | POA: Diagnosis not present

## 2022-12-26 ENCOUNTER — Other Ambulatory Visit (HOSPITAL_BASED_OUTPATIENT_CLINIC_OR_DEPARTMENT_OTHER): Payer: Self-pay

## 2022-12-26 MED ORDER — COMIRNATY 30 MCG/0.3ML IM SUSY
PREFILLED_SYRINGE | INTRAMUSCULAR | 0 refills | Status: AC
  Filled 2022-12-26: qty 0.3, 1d supply, fill #0

## 2023-03-27 DIAGNOSIS — H04123 Dry eye syndrome of bilateral lacrimal glands: Secondary | ICD-10-CM | POA: Diagnosis not present

## 2023-03-27 DIAGNOSIS — T1502XA Foreign body in cornea, left eye, initial encounter: Secondary | ICD-10-CM | POA: Diagnosis not present

## 2023-03-27 DIAGNOSIS — I2510 Atherosclerotic heart disease of native coronary artery without angina pectoris: Secondary | ICD-10-CM | POA: Diagnosis not present

## 2023-03-27 DIAGNOSIS — R0600 Dyspnea, unspecified: Secondary | ICD-10-CM | POA: Diagnosis not present

## 2023-03-27 DIAGNOSIS — R011 Cardiac murmur, unspecified: Secondary | ICD-10-CM | POA: Diagnosis not present

## 2023-03-27 DIAGNOSIS — I10 Essential (primary) hypertension: Secondary | ICD-10-CM | POA: Diagnosis not present

## 2023-04-03 ENCOUNTER — Other Ambulatory Visit (HOSPITAL_BASED_OUTPATIENT_CLINIC_OR_DEPARTMENT_OTHER): Payer: Self-pay

## 2023-04-03 MED ORDER — FLUAD 0.5 ML IM SUSY
0.5000 mL | PREFILLED_SYRINGE | Freq: Once | INTRAMUSCULAR | 0 refills | Status: AC
  Filled 2023-04-03: qty 0.5, 1d supply, fill #0

## 2023-04-22 ENCOUNTER — Other Ambulatory Visit: Payer: Self-pay | Admitting: Cardiology

## 2023-04-22 DIAGNOSIS — Z1231 Encounter for screening mammogram for malignant neoplasm of breast: Secondary | ICD-10-CM

## 2023-05-12 ENCOUNTER — Emergency Department (HOSPITAL_BASED_OUTPATIENT_CLINIC_OR_DEPARTMENT_OTHER): Payer: Medicare HMO

## 2023-05-12 ENCOUNTER — Other Ambulatory Visit: Payer: Self-pay

## 2023-05-12 ENCOUNTER — Encounter (HOSPITAL_BASED_OUTPATIENT_CLINIC_OR_DEPARTMENT_OTHER): Payer: Self-pay | Admitting: Emergency Medicine

## 2023-05-12 ENCOUNTER — Emergency Department (HOSPITAL_BASED_OUTPATIENT_CLINIC_OR_DEPARTMENT_OTHER)
Admission: EM | Admit: 2023-05-12 | Discharge: 2023-05-12 | Disposition: A | Discharge status: Home / Self Care | Payer: Medicare HMO | Attending: Emergency Medicine | Admitting: Emergency Medicine

## 2023-05-12 DIAGNOSIS — M1711 Unilateral primary osteoarthritis, right knee: Secondary | ICD-10-CM | POA: Diagnosis not present

## 2023-05-12 DIAGNOSIS — I10 Essential (primary) hypertension: Secondary | ICD-10-CM | POA: Insufficient documentation

## 2023-05-12 DIAGNOSIS — W01198A Fall on same level from slipping, tripping and stumbling with subsequent striking against other object, initial encounter: Secondary | ICD-10-CM | POA: Insufficient documentation

## 2023-05-12 DIAGNOSIS — Y92014 Private driveway to single-family (private) house as the place of occurrence of the external cause: Secondary | ICD-10-CM | POA: Insufficient documentation

## 2023-05-12 DIAGNOSIS — S025XXA Fracture of tooth (traumatic), initial encounter for closed fracture: Secondary | ICD-10-CM | POA: Diagnosis not present

## 2023-05-12 DIAGNOSIS — M1712 Unilateral primary osteoarthritis, left knee: Secondary | ICD-10-CM | POA: Diagnosis not present

## 2023-05-12 DIAGNOSIS — S0081XA Abrasion of other part of head, initial encounter: Secondary | ICD-10-CM

## 2023-05-12 DIAGNOSIS — M25562 Pain in left knee: Secondary | ICD-10-CM | POA: Diagnosis not present

## 2023-05-12 DIAGNOSIS — S80211A Abrasion, right knee, initial encounter: Secondary | ICD-10-CM | POA: Diagnosis not present

## 2023-05-12 DIAGNOSIS — W19XXXA Unspecified fall, initial encounter: Secondary | ICD-10-CM

## 2023-05-12 DIAGNOSIS — M25561 Pain in right knee: Secondary | ICD-10-CM | POA: Diagnosis not present

## 2023-05-12 DIAGNOSIS — M5459 Other low back pain: Secondary | ICD-10-CM | POA: Diagnosis not present

## 2023-05-12 DIAGNOSIS — Z7982 Long term (current) use of aspirin: Secondary | ICD-10-CM | POA: Insufficient documentation

## 2023-05-12 DIAGNOSIS — S0993XA Unspecified injury of face, initial encounter: Secondary | ICD-10-CM | POA: Diagnosis not present

## 2023-05-12 DIAGNOSIS — M545 Low back pain, unspecified: Secondary | ICD-10-CM | POA: Insufficient documentation

## 2023-05-12 DIAGNOSIS — R519 Headache, unspecified: Secondary | ICD-10-CM | POA: Insufficient documentation

## 2023-05-12 DIAGNOSIS — S80219A Abrasion, unspecified knee, initial encounter: Secondary | ICD-10-CM

## 2023-05-12 DIAGNOSIS — S0990XA Unspecified injury of head, initial encounter: Secondary | ICD-10-CM | POA: Diagnosis not present

## 2023-05-12 DIAGNOSIS — S199XXA Unspecified injury of neck, initial encounter: Secondary | ICD-10-CM | POA: Diagnosis not present

## 2023-05-12 DIAGNOSIS — S80212A Abrasion, left knee, initial encounter: Secondary | ICD-10-CM | POA: Insufficient documentation

## 2023-05-12 MED ORDER — LIDOCAINE 5 % EX PTCH
1.0000 | MEDICATED_PATCH | CUTANEOUS | Status: DC
  Administered 2023-05-12: 1 via TRANSDERMAL
  Filled 2023-05-12: qty 1

## 2023-05-12 MED ORDER — LIDOCAINE 5 % EX PTCH: 1.0000 | MEDICATED_PATCH | CUTANEOUS | 0 refills | Status: AC

## 2023-05-12 NOTE — ED Triage Notes (Signed)
Pt fell while trying to get in her car while it was rolling down the driveway; c/o bil knee pain +abrasions, chipped front tooth, pain just above LT eyebrow; denies LOC, no thinners
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
## 2023-05-12 NOTE — Discharge Instructions (Addendum)
You are seen in the emergency room after a fall.  Please call your dentist tomorrow morning and schedule an appointment for broken tooth.  Your imaging came back reassuring.  If you continue to have recommend taking Tylenol.  You can also apply Lidoderm patch to area of discomfort.  For your knee abrasions please cover with bacitracin or Neosporin keep clean and covered.  Please follow-up with your primary care doctor and return to emergency room with any new or worsening symptoms.

## 2023-05-31 ENCOUNTER — Ambulatory Visit
Admission: RE | Admit: 2023-05-31 | Discharge: 2023-05-31 | Disposition: A | Discharge status: Home / Self Care | Payer: Medicare HMO | Source: Ambulatory Visit | Attending: Cardiology | Admitting: Cardiology

## 2023-05-31 DIAGNOSIS — Z1231 Encounter for screening mammogram for malignant neoplasm of breast: Secondary | ICD-10-CM

## 2024-03-24 ENCOUNTER — Other Ambulatory Visit (HOSPITAL_BASED_OUTPATIENT_CLINIC_OR_DEPARTMENT_OTHER): Payer: Self-pay

## 2024-03-24 MED ORDER — FLUZONE HIGH-DOSE 0.5 ML IM SUSY
0.5000 mL | PREFILLED_SYRINGE | Freq: Once | INTRAMUSCULAR | 0 refills | Status: AC
  Filled 2024-03-24: qty 0.5, 1d supply, fill #0

## 2024-04-10 ENCOUNTER — Other Ambulatory Visit (HOSPITAL_BASED_OUTPATIENT_CLINIC_OR_DEPARTMENT_OTHER): Payer: Self-pay

## 2024-04-10 MED ORDER — COMIRNATY 30 MCG/0.3ML IM SUSY
0.3000 mL | PREFILLED_SYRINGE | Freq: Once | INTRAMUSCULAR | 0 refills | Status: AC
  Filled 2024-04-10: qty 0.3, 1d supply, fill #0

## 2024-04-13 ENCOUNTER — Other Ambulatory Visit (HOSPITAL_BASED_OUTPATIENT_CLINIC_OR_DEPARTMENT_OTHER): Payer: Self-pay

## 2024-04-23 ENCOUNTER — Other Ambulatory Visit: Payer: Self-pay | Admitting: Cardiology

## 2024-04-23 DIAGNOSIS — Z1231 Encounter for screening mammogram for malignant neoplasm of breast: Secondary | ICD-10-CM

## 2024-06-01 ENCOUNTER — Ambulatory Visit
Admission: RE | Admit: 2024-06-01 | Discharge: 2024-06-01 | Disposition: A | Source: Ambulatory Visit | Attending: Cardiology | Admitting: Cardiology

## 2024-06-01 DIAGNOSIS — Z1231 Encounter for screening mammogram for malignant neoplasm of breast: Secondary | ICD-10-CM
